# Patient Record
Sex: Female | Born: 1985 | Race: Black or African American | Hispanic: No | Marital: Single | Smoking: Current every day smoker
Health system: Southern US, Community
[De-identification: ages and names within clinical notes are randomized; demographics above are authoritative.]

## PROBLEM LIST (undated history)

## (undated) ENCOUNTER — Inpatient Hospital Stay: Discharge: 2008-10-28 | Disposition: A | Discharge status: Home / Self Care | Payer: PRIVATE HEALTH INSURANCE

## (undated) ENCOUNTER — Inpatient Hospital Stay (HOSPITAL_COMMUNITY): Payer: Self-pay

## (undated) HISTORY — PX: OTHER SURGICAL HISTORY: SHX169

---

## 2007-08-10 LAB — CBC WITH AUTOMATED DIFF
ABS. BASOPHILS: 0 10*3/uL (ref 0.0–0.1)
ABS. EOSINOPHILS: 0.1 10*3/uL (ref 0.0–0.4)
ABS. LYMPHOCYTES: 2 10*3/uL (ref 0.8–3.5)
ABS. MONOCYTES: 0.6 10*3/uL (ref 0–1.0)
ABS. NEUTROPHILS: 6.7 10*3/uL (ref 1.8–8.0)
BASOPHILS: 0 % (ref 0–1)
EOSINOPHILS: 1 % (ref 0–7)
HCT: 30.7 % — ABNORMAL LOW (ref 35.0–47.0)
HGB: 10.4 g/dL — ABNORMAL LOW (ref 11.5–16.0)
LYMPHOCYTES: 21 % (ref 12–49)
MCH: 30.9 PG (ref 26.0–34.0)
MCHC: 33.9 g/dL (ref 30.0–35.0)
MCV: 91.1 FL (ref 80.0–99.0)
MONOCYTES: 7 % (ref 5–13)
NEUTROPHILS: 71 % (ref 32–75)
PLATELET: 184 10*3/uL (ref 150–400)
RBC: 3.37 M/uL — ABNORMAL LOW (ref 3.80–5.20)
RDW: 13.5 % (ref 11.5–14.5)
WBC: 9.3 10*3/uL (ref 3.6–11.0)

## 2007-08-11 LAB — CBC WITH AUTOMATED DIFF
ABS. BASOPHILS: 0 10*3/uL (ref 0.0–0.1)
ABS. EOSINOPHILS: 0 10*3/uL (ref 0.0–0.4)
ABS. LYMPHOCYTES: 1.3 10*3/uL (ref 0.8–3.5)
ABS. MONOCYTES: 0.5 10*3/uL (ref 0–1.0)
ABS. NEUTROPHILS: 8.2 10*3/uL — ABNORMAL HIGH (ref 1.8–8.0)
BASOPHILS: 0 % (ref 0–1)
EOSINOPHILS: 0 % (ref 0–7)
HCT: 24.6 % — ABNORMAL LOW (ref 35.0–47.0)
HGB: 8.3 g/dL — ABNORMAL LOW (ref 11.5–16.0)
LYMPHOCYTES: 13 % (ref 12–49)
MCH: 30.7 PG (ref 26.0–34.0)
MCHC: 33.7 g/dL (ref 30.0–35.0)
MCV: 91.1 FL (ref 80.0–99.0)
MONOCYTES: 5 % (ref 5–13)
NEUTROPHILS: 82 % — ABNORMAL HIGH (ref 32–75)
PLATELET: 151 10*3/uL (ref 150–400)
RBC: 2.7 M/uL — ABNORMAL LOW (ref 3.80–5.20)
RDW: 13.6 % (ref 11.5–14.5)
WBC: 10.1 10*3/uL (ref 3.6–11.0)

## 2007-08-11 NOTE — Op Note (Signed)
Name: Whitney Bell, Whitney Bell  MR #: [id number] Surgeon: Enid Baas A. Rosine Door,   M.D.  Account #: [id number] Surgery Date: 08/10/2007  DOB: Dec 15, 1985  Age: 22 Location: [id number]     OPERATIVE REPORT        INDICATIONS: She is a 22 year old, G2, para 1 with a previous cesarean  section, who is at term. She came in at term for a repeat C-section.    PAST MEDICAL SURGICAL HISTORY: Medical: She denies.  Surgical: C-section.    SURGEON: Petra Dumler A. Rosine Door, MD    ASSISTANT: _____    ANESTHESIA: _____    FINDINGS: A female infant in vertex presentation. Normal tubes, ovaries,  and uterus.    COMPLICATIONS: None.    ESTIMATED BLOOD LOSS: 1000 mL.    INTRAVENOUS FLUIDS: Around 1400 mL of lactated Ringer's.    URINE OUTPUT: 180 mL of clear urine.    PATHOLOGY: None.    DESCRIPTION OF PROCEDURE: After assuring informed consent and expressed to  the patient risks, benefits, and alternatives, the patient was taken to the  OR where she signed the consent form. She was taken to the OR. She was  prepped and draped in the usual sterile fashion. A spinal anesthesia was  given. She was placed in dorsal lithotomy position. A Pfannenstiel skin  incision was made and carried to the level of the fascia. The fascia was  nicked up in the middle and extended bilaterally with the Mayo scissors.  The vesicouterine peritoneum was entered sharply with Metzenbaum and  extended bilaterally. An Jon Gills was used. The uterus was entered sharply  with a scalpel and extended bilaterally with the bandage scissors. The  baby's head was delivered atraumatically. Oropharynx was suctioned. There  was a cord around the neck that was reduced, clamped, and cut. The baby  was handed off to the pediatrician. The placenta was manually removed.  The uterus was exteriorized and cleared of all clots and debris. A 0  Vicryl in a running locking fashion was placed in the uterus. Complete  hemostasis was seen. A second 0 Vicryl was used to imbricate the first one   in a running fashion. The gutters were cleaned. The uterus was returned  to the abdomen. The Jon Gills was removed from the abdomen and we put  Intercede in the eschar incision of the uterus. The muscle was  reapproximated with 3-0 Vicryl and the fascia was closed with 1 Vicryl in a  running fashion. The skin was closed with staple. Sponge and instrument  counts were correct x2. A pressure sterile dressing was placed over the  staple. The patient tolerated the procedure well, and she was transferred  to the recovery room in stable condition.            E-Signed By  Alika Saladin A. Rosine Door, M.D. 08/13/2007 11:23  Katonya Blecher A. Rosine Door, M.D.    cc: Tyrica Afzal A. Rosine Door, M.D.        BAE/FI2; D: 08/11/2007 9:49 A; T: 08/11/2007 10:29 A; Doc# [id number]; Job#  [id number]

## 2007-08-11 NOTE — Op Note (Signed)
Op Notes signed by  at 08/13/07 1123                  Author: Darryl Lent, MD  Service: --  Author Type: Physician            Filed:   Date of Service: 08/11/07 1029  Status: Signed          <!--EPICS--> Name:      Whitney Bell, Whitney Bell MR #:      [id number]                    Surgeon:        Enid Baas A. Rosine Door, <BR> M.D.<BR> Account #: [id number]                  Surgery Date:   08/10/2007<BR> DOB:       [birth year]<BR> Age:       22                           Location:       [id number]<BR> <BR>                              OPERATIVE REPORT<BR> <BR> <BR> <BR> INDICATIONS:  She is a 22 year old, G2,  para 1 with a previous cesarean<BR> section, who is at term.  She came in at term for a repeat C-section.<BR> <BR> PAST MEDICAL   SURGICAL HISTORY:  Medical:  She denies.<BR> Surgical:  C-section.<BR> <BR> SURGEON:  Ahkeem Goede A. Rosine Door, MD<BR> <BR> ASSISTANT:   _____<BR> <BR> ANESTHESIA:  _____<BR> <BR> FINDINGS:  A female infant in vertex presentation.  Normal tubes, ovaries,<BR> and uterus.<BR> <BR> COMPLICATIONS:  None.<BR> <BR> ESTIMATED BLOOD LOSS:  1000 mL.<BR> <BR> INTRAVENOUS FLUIDS:  Around 1400 mL  of lactated Ringer's.<BR> <BR> URINE OUTPUT:  180 mL of clear urine.<BR> <BR> PATHOLOGY:  None.<BR> <BR> DESCRIPTION OF PROCEDURE:  After assuring informed consent and expressed to<BR> the patient risks, benefits, and alternatives, the patient was taken  to the<BR> OR where she signed the consent form.  She was taken to the OR.  She was<BR> prepped and draped in the usual sterile fashion.  A spinal anesthesia was<BR> given.  She was placed in dorsal lithotomy position.  A Pfannenstiel skin<BR> incision  was made and carried to the level of the fascia.  The fascia was<BR> nicked up in the middle and extended bilaterally with the Mayo scissors.<BR> The vesicouterine peritoneum was entered sharply with Metzenbaum and<BR> extended bilaterally.  An Jon Gills  was used.  The uterus was entered sharply<BR> with a  scalpel and extended bilaterally with the bandage scissors.  The<BR> baby's head was delivered atraumatically.  Oropharynx was suctioned.  There<BR> was a cord around the neck that was reduced, clamped,  and cut.  The baby<BR> was handed off to the pediatrician.  The placenta was manually removed.<BR> The uterus was exteriorized and cleared of all clots and debris.  A 0<BR> Vicryl in a running locking fashion was placed in the uterus.  Complete<BR> hemostasis  was seen.  A second 0 Vicryl was used to imbricate the first one<BR> in a running fashion.  The gutters were cleaned.  The uterus was returned<BR> to the abdomen.  The Jon Gills was removed from the abdomen and we put<BR> Intercede in the eschar incision  of the uterus.  The muscle was<BR> reapproximated with 3-0 Vicryl and the fascia was closed  with 1 Vicryl in a<BR> running fashion.  The skin was closed with staple.  Sponge and instrument<BR> counts were correct x2.  A pressure sterile dressing was placed  over the<BR> staple.  The patient tolerated the procedure well, and she was transferred<BR> to the recovery room in stable condition.<BR> <BR> <BR> <BR> <BR> <BR> E-Signed By<BR> Olubunmi Rothenberger A. Rosine Door, M.D. 08/13/2007 11:23<BR> Onda Kattner A. Rosine Door, M.D.<BR>  <BR> cc:   Sotiria Keast A. Rosine Door, M.D.<BR> <BR> <BR> <BR> BAE/FI2; D: 08/11/2007  9:49 A; T: 08/11/2007 10:29 A; Doc# [id number]; Job#<BR> [id number]<BJ> <!--EPICE-->

## 2008-02-05 LAB — URINALYSIS W/ REFLEX CULTURE
Bilirubin: NEGATIVE
Blood: NEGATIVE
Epithelial cells: >100 /LPF — ABNORMAL HIGH (ref 0–5)
Glucose: NEGATIVE MG/DL
Ketone: NEGATIVE MG/DL
Leukocyte Esterase: NEGATIVE
Nitrites: NEGATIVE
Protein: NEGATIVE MG/DL
Specific gravity: 1.015 (ref 1.003–1.030)
Urobilinogen: 1 EU/DL (ref 0.2–1.0)
pH (UA): 7.5 (ref 5.0–8.0)

## 2008-02-05 LAB — KOH, OTHER SOURCES: KOH: NONE SEEN

## 2008-02-05 LAB — WET PREP: Wet prep: NONE SEEN

## 2008-02-07 LAB — CULTURE, URINE
Colonies Counted: 50000
Colony Count: 50000

## 2008-02-08 LAB — CHLAMYDIA/GC DNA PROBE
C. trachomatis - DNA probe: NEGATIVE
N. gonorrhoeae - DNA probe: NEGATIVE

## 2008-03-03 LAB — METABOLIC PANEL, COMPREHENSIVE
A-G Ratio: 1 — ABNORMAL LOW (ref 1.1–2.2)
ALT (SGPT): 20 U/L — ABNORMAL LOW (ref 30–65)
AST (SGOT): 15 U/L (ref 15–37)
Albumin: 3.8 g/dL (ref 3.5–5.0)
Alk. phosphatase: 91 U/L (ref 50–136)
Anion gap: 10 mmol/L (ref 5–15)
BUN/Creatinine ratio: 27 — ABNORMAL HIGH (ref 12–20)
BUN: 16 MG/DL (ref 6–20)
Bilirubin, total: 0.4 MG/DL (ref <1.0)
CO2: 25 MMOL/L (ref 21–32)
Calcium: 9.1 MG/DL (ref 8.5–10.1)
Chloride: 102 MMOL/L (ref 97–108)
Creatinine: 0.6 MG/DL (ref 0.6–1.3)
GFR est AA: >60 mL/min/{1.73_m2} (ref >60)
GFR est non-AA: >60 mL/min/{1.73_m2} (ref >60)
Globulin: 4 g/dL (ref 2.0–4.0)
Glucose: 83 MG/DL (ref 65–105)
Potassium: 4.1 MMOL/L (ref 3.5–5.1)
Protein, total: 7.8 g/dL (ref 6.4–8.2)
Sodium: 137 MMOL/L (ref 136–145)

## 2008-03-03 LAB — CBC WITH AUTOMATED DIFF
ABS. BASOPHILS: 0 10*3/uL (ref 0.0–0.1)
ABS. EOSINOPHILS: 0.1 10*3/uL (ref 0.0–0.4)
ABS. LYMPHOCYTES: 0.8 10*3/uL (ref 0.8–3.5)
ABS. MONOCYTES: 0.7 10*3/uL (ref 0.0–1.0)
ABS. NEUTROPHILS: 11.5 10*3/uL — ABNORMAL HIGH (ref 1.8–8.0)
BASOPHILS: 0 % (ref 0–1)
EOSINOPHILS: 1 % (ref 0–7)
HCT: 37.5 % (ref 35.0–47.0)
HGB: 12.7 g/dL (ref 11.5–16.0)
LYMPHOCYTES: 6 % — ABNORMAL LOW (ref 12–49)
MCH: 30.1 PG (ref 26.0–34.0)
MCHC: 33.9 g/dL (ref 30.0–36.5)
MCV: 88.9 FL (ref 80.0–99.0)
MONOCYTES: 5 % (ref 5–13)
NEUTROPHILS: 88 % — ABNORMAL HIGH (ref 32–75)
PLATELET: 229 10*3/uL (ref 150–400)
RBC: 4.22 M/uL (ref 3.80–5.20)
RDW: 13.8 % (ref 11.5–14.5)
WBC: 13.1 10*3/uL — ABNORMAL HIGH (ref 3.6–11.0)

## 2008-03-03 LAB — KOH, OTHER SOURCES: KOH: NONE SEEN

## 2008-03-03 LAB — WET PREP
Clue cells: ABSENT
Wet prep: NONE SEEN

## 2008-03-03 LAB — URINALYSIS W/ REFLEX CULTURE
Bacteria: NEGATIVE /HPF
Bilirubin: NEGATIVE
Blood: NEGATIVE
Glucose: NEGATIVE MG/DL
Leukocyte Esterase: NEGATIVE
Nitrites: NEGATIVE
Protein: NEGATIVE MG/DL
Specific gravity: 1.025 (ref 1.003–1.030)
Urobilinogen: 0.2 EU/DL (ref 0.2–1.0)
pH (UA): 6 (ref 5.0–8.0)

## 2008-03-03 LAB — LIPASE: Lipase: 148 U/L (ref 114–286)

## 2008-03-03 LAB — AMYLASE: Amylase: 79 U/L (ref 25–115)

## 2008-03-04 LAB — CHLAMYDIA/GC DNA PROBE
C. trachomatis - DNA probe: NEGATIVE
N. gonorrhoeae - DNA probe: NEGATIVE

## 2008-08-07 LAB — URINALYSIS W/ REFLEX CULTURE
Bacteria: NEGATIVE /HPF
Bilirubin: NEGATIVE
Blood: NEGATIVE
Glucose: NEGATIVE MG/DL
Ketone: NEGATIVE MG/DL
Leukocyte Esterase: NEGATIVE
Nitrites: NEGATIVE
Protein: NEGATIVE MG/DL
Specific gravity: 1.02 (ref 1.003–1.030)
Urobilinogen: 0.2 EU/DL (ref 0.2–1.0)
pH (UA): 8 (ref 5.0–8.0)

## 2008-08-07 NOTE — ED Provider Notes (Signed)
Patient is a 23 y.o. female presenting with abdominal pain. The history is provided by the patient.   Abdominal Pain   This is a recurrent problem. Episode onset: 2 wks ago. Episode frequency: intermittently. The problem has been gradually worsening. The pain is associated with an unknown factor. The pain is located in the suprapubic region. The quality of the pain is sharp. The pain is at a severity of 7/10. The pain is moderate. Associated symptoms include nausea, vomiting and back pain. Pertinent negatives include no fever, no diarrhea, no dysuria, no frequency and no hematuria. mild vaginal discharge. Nothing worsens the pain. The pain is relieved by nothing. Pt states she has Mirena x 65yr but this is the first time pt states that she has had an irregular period       Past Medical History   Diagnosis Date   ??? Asthma           Past Surgical History   Procedure Date   ??? Delivery c-section            No family history on file.     History   Social History   ??? Marital Status: Single     Spouse Name: N/A     Number of Children: N/A   ??? Years of Education: N/A   Occupational History   ??? Not on file.   Social History Main Topics   ??? Tobacco Use: Yes   ??? Alcohol Use: No   ??? Drug Use: No   ??? Sexually Active: Yes -- Female partner(s)     Birth Control/ Protection: IUD   Other Topics Concern   ??? Not on file   Social History Narrative   ??? No narrative on file           ALLERGIES: Review of patient's allergies indicates no known allergies.      Review of Systems   Constitutional: Negative for fever.   HENT: Negative.    Eyes: Negative.    Respiratory: Negative.    Cardiovascular: Negative.    Gastrointestinal: Positive for nausea, vomiting and abdominal pain. Negative for diarrhea.   Genitourinary: Negative for dysuria, frequency and hematuria.   Musculoskeletal: Positive for back pain.   Neurological: Negative.        Filed Vitals:    08/07/2008  6:50 PM   BP: 120/67   Pulse: 76   Temp: 98.8 ??F (37.1 ??C)   Resp: 18    Height: 5\' 4"  (1.626 m)   Weight: 140 lb (63.504 kg)   SpO2: 98%              Physical Exam   Constitutional: She is oriented to person, place, and time. She appears well-developed and well-nourished. No distress.   HENT:   Head: Normocephalic and atraumatic.   Eyes: Conjunctivae and extraocular motions are normal. Pupils are equal, round, and reactive to light.   Cardiovascular: Normal rate, regular rhythm and normal heart sounds.  Exam reveals no gallop and no friction rub.    No murmur heard.  Pulmonary/Chest: Effort normal and breath sounds normal. No respiratory distress. She has no wheezes. She has no rales.   Abdominal: Soft. Bowel sounds are normal. She exhibits no distension. No tenderness. She has no rebound and no guarding.   Genitourinary: Vagina normal. Cervix exhibits no motion tenderness. Right adnexum displays tenderness. Left adnexum displays tenderness. No discharge found.   Musculoskeletal: Normal range of motion.   Neurological: She is alert and  oriented to person, place, and time.   Skin: Skin is warm and dry.   Psychiatric: She has a normal mood and affect. Her behavior is normal. Judgment and thought content normal.            Coding      Procedures

## 2008-08-07 NOTE — ED Provider Notes (Signed)
I have personally seen and evaluated patient. I find the patient's history and physical exam are consistent with the PA's NP documentation. I agree with the care provided, treatments rendered, disposition and follow up plan.  I have personally seen and evaluated patient. I find the patient's history and physical exam are consistent with the PA's NP documentation. I agree with the care provided, treatments rendered, disposition and follow up plan.

## 2008-08-07 NOTE — ED Notes (Signed)
Discuss with attending will treat pt prophylatically for possible infection secondary to IUD

## 2008-08-08 MED ADMIN — cefTRIAXone (ROCEPHIN) /lidocaine pf IM injection 250mg: INTRAMUSCULAR | @ 01:00:00 | NDC 63323049205

## 2008-08-08 MED ADMIN — azithromycin (ZITHROMAX) tablet 1,000 mg: ORAL | @ 01:00:00 | NDC 68084027811

## 2008-08-08 MED FILL — AZITHROMYCIN 250 MG TAB: 250 mg | ORAL | Qty: 4

## 2008-08-08 MED FILL — XYLOCAINE-MPF 10 MG/ML (1 %) INJECTION SOLUTION: 10 mg/mL (1 %) | INTRAMUSCULAR | Qty: 5

## 2008-10-28 NOTE — ED Provider Notes (Signed)
History provided by: LWBS.        Past Medical History   Diagnosis Date   ??? Asthma           Past Surgical History   Procedure Date   ??? Delivery c-section            No family history on file.     History   Social History   ??? Marital Status: Single     Spouse Name: N/A     Number of Children: N/A   ??? Years of Education: N/A   Occupational History   ??? Not on file.   Social History Main Topics   ??? Tobacco Use: Yes   ??? Alcohol Use: No   ??? Drug Use: No   ??? Sexually Active: Yes -- Female partner(s)     Birth Control/ Protection: IUD   Other Topics Concern   ??? Not on file   Social History Narrative   ??? No narrative on file           ALLERGIES: Review of patient's allergies indicates no known allergies.      Review of Systems    There were no vitals filed for this visit.         Physical Exam   Constitutional:        LWBS        Coding    Procedures

## 2009-04-17 NOTE — ED Notes (Signed)
First call to treatment area, no response.

## 2009-04-17 NOTE — ED Notes (Signed)
Second call to the treatment area, no response.

## 2009-04-18 NOTE — ED Notes (Signed)
I was inadvertently assigned to this patient's treatment team.  I did not see this patient nor did I have any contact with this patient.  I had no involvement during the evaluation, treatment or disposition of this patient.  I am signing off this note to indicate only why my name appeared in the record.

## 2009-09-04 LAB — HCG URINE, QL. - POC: Pregnancy test,urine (POC): NEGATIVE

## 2009-09-04 MED ORDER — NITROFURANTOIN (25% MACROCRYSTAL FORM) 100 MG CAP
100 mg | ORAL_CAPSULE | Freq: Two times a day (BID) | ORAL | Status: AC
Start: 2009-09-04 — End: 2009-09-09

## 2009-09-04 NOTE — ED Notes (Signed)
Pt c/o constant LLQ abd pain that radiates to lower back and yellowish vaginal discharge.

## 2009-09-04 NOTE — ED Notes (Signed)
Pelvic exam tray set up in room 8

## 2009-09-04 NOTE — ED Notes (Addendum)
Pt states, "I can't stay. I have to get my kids."; pt refusing pelvic exam,, Physician at bedside speaking with pt

## 2009-09-04 NOTE — ED Notes (Signed)
I agree with the nurse's assessment

## 2009-09-04 NOTE — ED Notes (Signed)
Pt discharged by MD. Pt verbalizes understanding.

## 2009-09-04 NOTE — ED Provider Notes (Addendum)
HPI Comments: 24 y/o sexually active bf presensts to ED with complaint of left lower abdominal pain, vaginal discharge and concern about her IUD not being in the right place. Pt has had IUD for over 2 years.    Patient is a 24 y.o. female presenting with abdominal pain. The history is provided by the patient. No language interpreter was used.   Abdominal Pain   This is a recurrent problem. The current episode started 6 to 12 hours ago. Episode frequency: intermittent. The problem has been gradually worsening. The pain is located in the LLQ. The quality of the pain is sharp. The pain is at a severity of 8/10. Nothing worsens the pain. Relieved by: rest and hot showers. iud 1-2 yrs       Past Medical History   Diagnosis Date   ??? Asthma           Past Surgical History   Procedure Date   ??? Delivery c-section            No family history on file.     History   Social History   ??? Marital Status: Single     Spouse Name: N/A     Number of Children: N/A   ??? Years of Education: N/A   Occupational History   ??? Not on file.   Social History Main Topics   ??? Smoking status: Current Everyday Smoker   ??? Smokeless tobacco: Not on file   ??? Alcohol Use: No   ??? Drug Use: No   ??? Sexually Active: Yes -- Female partner(s)     Birth Control/ Protection: IUD   Other Topics Concern   ??? Not on file   Social History Narrative   ??? No narrative on file                    ALLERGIES: Review of patient's allergies indicates no known allergies.      Review of Systems   Gastrointestinal: Positive for abdominal pain.   Genitourinary: Positive for vaginal discharge.   All other systems reviewed and are negative.        Filed Vitals:    09/04/09 1424   BP: 90/56   Pulse: 84   Temp: 98.7 ??F (37.1 ??C)   Resp: 16   Height: 5\' 3"  (1.6 m)   Weight: 150 lb (68.04 kg)   SpO2: 99%              Physical Exam   Nursing note and vitals reviewed.  Constitutional: She is oriented to person, place, and time. She appears well-developed and well-nourished.   HENT:    Head: Normocephalic and atraumatic.   Right Ear: External ear normal.   Left Ear: External ear normal.   Mouth/Throat: Oropharynx is clear and moist. No oropharyngeal exudate.   Eyes: Conjunctivae and EOM are normal. Pupils are equal, round, and reactive to light. Right eye exhibits no discharge. Left eye exhibits no discharge. No scleral icterus.   Neck: Normal range of motion. No tracheal deviation present. No thyromegaly present.   Cardiovascular: Normal rate, regular rhythm and normal heart sounds.    No murmur heard.  Pulmonary/Chest: Effort normal and breath sounds normal. No respiratory distress. She has no wheezes. She has no rales. She exhibits no tenderness.   Abdominal: Soft. She exhibits no distension. No tenderness. She has no rebound and no guarding.   Genitourinary:        Pelvic exam not completed. Pt  insisted on leaving ED   Musculoskeletal: Normal range of motion. She exhibits no edema and no tenderness.   Lymphadenopathy:     She has no cervical adenopathy.   Neurological: She is alert and oriented to person, place, and time. No cranial nerve deficit. Coordination normal.   Skin: Skin is warm. No erythema.   Psychiatric: She has a normal mood and affect. Her behavior is normal. Judgment and thought content normal.        MDM    Procedures  I have personally seen and evaluated patient. I find the patient's history and physical exam are consistent with the PA's NP documentation. I agree with the care provided, treatments rendered, disposition and follow up plan.

## 2010-03-20 LAB — AMB POC URINALYSIS DIP STICK AUTO W/O MICRO
Bilirubin (UA POC): NEGATIVE
Blood (UA POC): NEGATIVE
Glucose (UA POC): NEGATIVE
Ketones (UA POC): NEGATIVE
Leukocyte esterase (UA POC): NEGATIVE
Nitrites (UA POC): NEGATIVE
Protein (UA POC): NEGATIVE mg/dL
Specific gravity (UA POC): 1.03 (ref 1.001–1.035)
Urobilinogen (UA POC): 0.2
pH (UA POC): 6.5 (ref 4.6–8.0)

## 2010-03-20 MED ORDER — CITALOPRAM 20 MG TAB
20 mg | ORAL_TABLET | Freq: Every day | ORAL | Status: AC
Start: 2010-03-20 — End: 2010-04-19

## 2010-03-20 NOTE — Progress Notes (Signed)
Pt. Given 2nd hep b vaccine 1ml left deltiod, Pt. Was late, first vaccine was in nov 2011,  Per Dr Cliffton Asters needs to return 09/18/10.

## 2010-03-20 NOTE — Progress Notes (Signed)
HISTORY OF PRESENT ILLNESS  Whitney Bell is a 25 y.o. female.  HPI She presents today as a new patient to establish care and with several complaints.   She is due for 2nd Hep B vaccine and had 1st in 11/11 with no problems.  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  She has a history of cold sore and take acyclovir prn outbreak.  She complains of feeling increasingly depressed and sad.  Happiness score 5/10.  Anhedonia, crying episodes and gaining wt.  No sleeping well.  Tylenol pm helps but often feels sleepy the next day.  Denies suicidal ideation.  She is going to school and is a single mother raising 2 young children.  She has frequent bilateral headaches.  She notes frequently feels fatigued.     She has an IUD and 1 x a month notes some sharp abdominal pain lasting 1 min and is concerned could be related to her IUD.  She is followed by gyn and has appt scheduled for evaluation of IUD.  Current outpatient prescriptions   Medication Sig Dispense Refill   ??? acyclovir (ZOVIRAX) 200 mg capsule Take  by mouth as needed.           No Known Allergies  Past Medical History   Diagnosis Date   ??? Asthma        Past Surgical History   Procedure Date   ??? Delivery c-section    ??? Hx gyn 01/04/2005  08-10-2007     2 c-sections       Family History   Problem Relation Age of Onset   ??? Cancer Maternal Aunt    ??? Cancer Maternal Grandmother      breast   ??? Diabetes Maternal Grandmother    ??? Hypertension Maternal Grandmother        History   Substance Use Topics   ??? Smoking status: Current Everyday Smoker     Types: Cigarettes   ??? Smokeless tobacco: Never Used    Comment: 3 cig. per day   ??? Alcohol Use: Yes      occasionally        ROS  as per HPI     Physical Exam  BP 118/84   Pulse 68   Resp 16   Ht 5\' 3"  (1.6 m)   Wt 161 lb 6.4 oz (73.211 kg)   BMI 28.59 kg/m2   LMP 03/16/2010   GENERAL:  Pleasant female in no apparent distress.  HEENT:  Normocephalic, atraumatic.  Sinuses nontender. Posterior Pharynx clear, no erythema.  NECK:  Supple, full range of motion.  No thyromegaly or adenopathy.  No carotid bruits auscultated.  BACK:  Nontender.  RESPIRATORY:  Lungs clear to auscultation bilaterally without wheezes or crackles.  CARDIAC:  Regular rate and rhythm, no murmurs, rubs or gallops.  BREASTS:  No dimples, retraction, color changes, nipple discharge, or masses present.  No supra- or infraclavicular or axillary nodes palpable.  ABDOMEN:  Soft, nontender.  Positive bowel sounds.  No masses. No hepatosplenomegaly.  EXTREMITIES: Peripheral pulses are symmetric and palpable.  No cyanosis, clubbing, or edema.  NEUROLOGIC:  Nonfocal.  ZO:[id number] to gyn.  SKIN: Normal appearance.      ASSESSMENT and PLAN  Encounter Diagnoses   Code Name Primary?   ??? 780.52A Insomnia likely related to current social stressors and depression  Tylenol pm prn Yes   ??? 784.0 Headache   Second to insomnia    ??? 311L Depression  Long discussion with patient regarding importance of treatment and risk verses benefits of medication treatment are discussed including potential side effects of medication.   Start celexa 20 mg qam with food and reviewed side effects    ??? 530.81S GERD (gastroesophageal reflux disease) mild  tums prn    ??? 780.79B Fatigue  Check screening labs    ??? V82.9AE Screening for blood or protein in urine    ??? V05.3AF Need for hepatitis B vaccination        Orders Placed This Encounter   ??? Hepatitis B vaccine, adult dosage, IM   ??? TSH, 3RD GENERATION   ??? METABOLIC PANEL, BASIC   ??? CBC WITH AUTOMATED DIFF   ??? AMB POC URINALYSIS DIP STICK AUTO W/O MICRO   ??? acyclovir (ZOVIRAX) 200 mg capsule   ??? levonorgestrel (MIRENA) 20 mcg/24 hr IUD   ??? citalopram (CELEXA) 20 mg tablet       Follow-up Disposition:  Return in about 3 weeks (around 04/10/2010).   lab results and schedule of future lab studies reviewed with patient  reviewed diet, exercise and weight control  very strongly urged to quit smoking to reduce cardiovascular risk  cardiovascular risk and specific lipid/LDL goals reviewed  reviewed medications and side effects in detail

## 2010-03-20 NOTE — Progress Notes (Signed)
Establish care.

## 2010-03-20 NOTE — Patient Instructions (Signed)
Insomnia: After Your Visit  Your Care Instructions  Insomnia is the inability to sleep well. It is a common problem for most people at some time. Insomnia may make it hard for you to get to sleep, stay asleep, or sleep as long as you need to. This can make you tired and grouchy during the day. It can also make you forgetful, less effective at work, and unhappy.  Insomnia can be caused by conditions such as depression or anxiety. Pain can also affect your ability to sleep. When these problems are solved, the insomnia usually clears up. But sometimes bad sleep habits can cause insomnia.  If insomnia is affecting your work or your enjoyment of life, you can take steps to improve your sleep.  Follow-up care is a key part of your treatment and safety. Be sure to make and go to all appointments, and call your doctor if you are having problems. It???s also a good idea to know your test results and keep a list of the medicines you take.  How can you care for yourself at home?  What to avoid  ?? Do not have drinks with caffeine, such as coffee or black tea, for 8 hours before bed.   ?? Do not smoke or use other types of tobacco near bedtime. Nicotine is a stimulant and can keep you awake.   ?? Avoid drinking alcohol late in the evening, because it can cause you to wake in the middle of the night.   ?? Do not eat a big meal close to bedtime. If you are hungry, eat a light snack.   ?? Do not drink a lot of water close to bedtime, because the need to urinate may wake you up during the night.   ?? Do not read or watch TV in bed. Use the bed only for sleeping and sexual activity.   What to try  ?? Go to bed at the same time every night, and wake up at the same time every morning. Do not take naps during the day.   ?? Keep your bedroom quiet, dark, and cool.   ?? Get regular exercise, but not within 3 to 4 hours of your bedtime.   ?? Sleep on a comfortable pillow and mattress.    ?? If watching the clock makes you anxious, turn it facing away from you so you cannot see the time.   ?? If you worry when you lie down, start a worry book. Well before bedtime, write down your worries, and then set the book and your concerns aside.   ?? Try meditation or other relaxation techniques before you go to bed.   ?? If you cannot fall asleep, get up and go to another room until you feel sleepy. Do something relaxing. Repeat your bedtime routine before you go to bed again.   ?? Make your house quiet and calm about an hour before bedtime. Turn down the lights, turn off the TV, log off the computer, and turn down the volume on music. This can help you relax after a busy day.   When should you call for help?  Watch closely for changes in your health, and be sure to contact your doctor if:  ?? Your efforts to improve your sleep do not work.   ?? Your insomnia gets worse.   ?? You have been feeling down, depressed, or hopeless or have lost interest in things that you usually enjoy.      Where can you  learn more?    Go to [email or web address]  Enter P513 in the search box to learn more about "Insomnia: After Your Visit."    ?? 2006-2011 [employer]. Care instructions adapted under license by [employer] (which disclaims liability or warranty for this information). This care instruction is for use with your licensed healthcare professional. If you have questions about a medical condition or this instruction, always ask your healthcare professional. [employer] disclaims any warranty or liability for your use of this information.  Content Version: 9.1.125182; Last Revised: June 26, 2010Depression Treatment: After Your Visit  Your Care Instructions   Depression is a condition that affects the way you feel, think, and act. It causes symptoms such as low energy, loss of interest in daily activities, and sadness or grouchiness that goes on for a long time. Depression is very common and affects men and women of all ages.  Depression is a medical illness caused by changes in the natural chemicals in your brain. It is not a character flaw, and it does not mean that you are a bad or weak person. It does not mean that you are going crazy.  It is important to know that depression can be treated. Medicines, counseling, and self-care can all help. Many people do not get help because they are embarrassed or think that they will get over the depression on their own. But some people do not get better without treatment.  Follow-up care is a key part of your treatment and safety. Be sure to make and go to all appointments, and call your doctor if you are having problems. It???s also a good idea to know your test results and keep a list of the medicines you take.  How can you care for yourself at home?  Learn about antidepressant medicines  Antidepressant medicines can improve or end the symptoms of depression. You may need to take the medicine for at least 6 months, and often longer. Keep taking your medicine even if you feel better. If you stop taking it too soon, your symptoms may come back or get worse.  You may start to feel better within 1 to 3 weeks of taking antidepressant medicine. But it can take as many as 6 to 8 weeks to see more improvement. Talk to your doctor if you have problems with your medicine or if you do not notice any improvement after 3 weeks.   Antidepressants can make you feel tired, dizzy, or nervous. Some people have dry mouth, constipation, headaches, sexual problems, an upset stomach, or diarrhea. Many of these side effects are mild and go away on their own after you take the medicine for a few weeks. Some may last longer. Talk to your doctor if side effects bother you too much. You might be able to try a different medicine. If you are pregnant or breast-feeding, talk to your doctor about what medicines you can take.  Learn about counseling  In many cases, counseling can work as well as medicines to treat mild to moderate depression. Counseling is done by licensed mental health providers, such as psychologists, social workers, and some types of nurses. It can be done in one-on-one sessions or in a group setting. Many people find group sessions helpful.  Cognitive-behavioral therapy is a type of counseling. In this treatment therapy, you learn how to see and change unhelpful thinking styles that may be adding to your depression. Counseling and medicines often work well when used together.  To manage depression  ??  Be physically active. Getting 30 minutes of exercise each day is good for your body and your mind. Begin slowly if it is hard for you to get started. If you already exercise, keep it up.   ?? Plan something pleasant for yourself every day. Include activities that you have enjoyed in the past.   ?? Get enough sleep. Talk to your doctor if you have problems sleeping.   ?? Eat a balanced diet. If you do not feel hungry, eat small snacks rather than large meals.   ?? Do not drink alcohol, use illegal drugs, or take medicines that your doctor has not prescribed for you. They may interfere with your treatment.   ?? Spend time with family and friends. It may help to speak openly about your depression with people you trust.   ?? Take your medicines exactly as prescribed. Call your doctor if you think you are having a problem with your medicine.    ?? Do not make major life decisions while you are depressed. Depression may change the way you think. You will be able to make better decisions after you feel better.   ?? Think positively. Challenge negative thoughts with statements such as "I am hopeful"; "Things will get better"; and "I can ask for the help I need." Write down these statements and read them often, even if you don't believe them yet.   ?? Be patient with yourself. It took time for your depression to develop, and it will take time for your symptoms to improve. Do not take on too much or be too hard on yourself.   ?? Learn all you can about depression from written and online materials.   ?? Check out behavioral health classes to learn more about dealing with depression.   When should you call for help?  Call 911 anytime you think you may need emergency care. For example, call if:  ?? You feel you cannot stop from hurting yourself or someone else.   Call your doctor now or seek immediate medical care if:  ?? You hear voices.   ?? You feel much more depressed.   Watch closely for changes in your health, and be sure to contact your doctor if:  ?? You are having problems with your depression medicine.   ?? You are not getting better as expected.      Where can you learn more?    Go to [facility]  Enter [phone] in the search box to learn more about "Depression Treatment: After Your Visit."    ?? 2006-2011 [employer]. Care instructions adapted under license by [employer] (which disclaims liability or warranty for this information). This care instruction is for use with your licensed healthcare professional. If you have questions about a medical condition or this instruction, always ask your healthcare professional. [employer] disclaims any warranty or liability for your use of this information.  Content Version: 9.1.125182; Last Revised: May 04, 2009

## 2010-03-20 NOTE — Progress Notes (Signed)
Patient is here today to establish care.  Wants to discuss that is reoccurring in ribs.

## 2010-03-21 LAB — CBC WITH AUTOMATED DIFF
ABS. BASOPHILS: 0 10*3/uL (ref 0.0–0.2)
ABS. EOSINOPHILS: 0.1 10*3/uL (ref 0.0–0.4)
ABS. IMM. GRANS.: 0 10*3/uL (ref 0.0–0.1)
ABS. LYMPHOCYTES: 2.1 10*3/uL (ref 0.7–4.5)
ABS. MONOCYTES: 0.3 10*3/uL (ref 0.1–1.0)
ABS. NEUTROPHILS: 4.1 10*3/uL (ref 1.8–7.8)
BASOPHILS: 0 % (ref 0–3)
EOSINOPHILS: 1 % (ref 0–7)
HCT: 39 % (ref 34.0–44.0)
HGB: 13 g/dL (ref 11.5–15.0)
IMMATURE GRANULOCYTES: 0 % (ref 0–2)
LYMPHOCYTES: 32 % (ref 14–46)
MCH: 31.8 pg (ref 27.0–34.0)
MCHC: 33.3 g/dL (ref 32.0–36.0)
MCV: 95 fL (ref 80–98)
MONOCYTES: 5 % (ref 4–13)
NEUTROPHILS: 62 % (ref 40–74)
PLATELET: 233 10*3/uL (ref 140–415)
RBC: 4.09 x10E6/uL (ref 3.80–5.10)
RDW: 13.7 % (ref 11.7–15.0)
WBC: 6.6 10*3/uL (ref 4.0–10.5)

## 2010-03-21 LAB — TSH 3RD GENERATION: TSH: 1.43 u[IU]/mL (ref 0.450–4.500)

## 2010-03-21 LAB — METABOLIC PANEL, BASIC
BUN/Creatinine ratio: 19 (ref 8–20)
BUN: 13 mg/dL (ref 6–20)
CO2: 22 mmol/L (ref 20–32)
Calcium: 9.4 mg/dL (ref 8.7–10.2)
Chloride: 103 mmol/L (ref 97–108)
Creatinine: 0.68 mg/dL (ref 0.57–1.00)
GFR est AA: 144 mL/min/{1.73_m2} (ref >59)
GFR est non-AA: 125 mL/min/{1.73_m2} (ref >59)
Glucose: 83 mg/dL (ref 65–99)
Potassium: 3.9 mmol/L (ref 3.5–5.2)
Sodium: 138 mmol/L (ref 134–144)

## 2010-04-14 MED ORDER — HYDROCODONE-ACETAMINOPHEN 5 MG-500 MG TAB
5-500 mg | ORAL_TABLET | ORAL | Status: AC | PRN
Start: 2010-04-14 — End: 2010-04-21

## 2010-04-14 MED ORDER — IBUPROFEN 600 MG TAB
600 mg | ORAL_TABLET | Freq: Four times a day (QID) | ORAL | Status: AC | PRN
Start: 2010-04-14 — End: 2010-05-14

## 2010-04-14 NOTE — ED Notes (Signed)
Patient (s)  given copy of dc instructions and 2 script(s).  Patient (s)  verbalized understanding of instructions and script (s).  Patient given a current medication reconciliation form and verbalized understanding of their medications.   Patient (s) verbalized understanding of the importance of discussing medications with  his or her physician or clinic they will be following up with.  Patient alert and oriented and in no acute distress.  Patient discharged home ambulatory with self.

## 2010-04-14 NOTE — ED Provider Notes (Signed)
Patient is a 25 y.o. female presenting with finger pain. The history is provided by the patient.   Finger Pain   This is a new problem. The current episode started 12 to 24 hours ago. The problem occurs constantly. The problem has not changed since onset.The pain is present in the right fingers (right thumb). The quality of the pain is described as aching. The pain is at a severity of 5/10. The pain is mild. Associated symptoms include limited range of motion and stiffness. Pertinent negatives include no numbness. The symptoms are aggravated by activity. She has tried nothing for the symptoms. There has been a history of trauma (fell going downstairs last night and jammed right thumb, pt is right handed).        Past Medical History   Diagnosis Date   ??? Asthma    ??? Recurrent genital HSV (herpes simplex virus) infection         Past Surgical History   Procedure Date   ??? Delivery c-section    ??? Hx gyn 01/04/2005  08-10-2007     2 c-sections         Family History   Problem Relation Age of Onset   ??? Cancer Maternal Aunt    ??? Cancer Maternal Grandmother      breast   ??? Diabetes Maternal Grandmother    ??? Hypertension Maternal Grandmother         History     Social History   ??? Marital Status: Single     Spouse Name: N/A     Number of Children: N/A   ??? Years of Education: N/A     Occupational History   ??? Not on file.     Social History Main Topics   ??? Smoking status: Current Everyday Smoker     Types: Cigarettes   ??? Smokeless tobacco: Never Used    Comment: 3 cig. per day   ??? Alcohol Use: Yes      occasionally   ??? Drug Use: No   ??? Sexually Active: Yes -- Female partner(s)     Birth Control/ Protection: IUD     Other Topics Concern   ??? 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      Review of Systems   Musculoskeletal: Positive for stiffness.   Neurological: Negative for numbness.    [all other systems reviewed and are negative        Filed Vitals:    04/14/10 1236   BP: 137/81   Pulse: 96   Temp: 98.4 ??F (36.9 ??C)   Resp: 18   Height: 5\' 2"  (1.575 m)   Weight: 165 lb (74.844 kg)   SpO2: 100%            Physical Exam   [nursing notereviewed.  Constitutional: She is oriented to person, place, and time. She appears well-developed and well-nourished.   HENT:   Head: Normocephalic and atraumatic.   Right Ear: External ear normal.   Left Ear: External ear normal.   Mouth/Throat: Oropharynx is clear and moist. No oropharyngeal exudate.   Eyes: Conjunctivae and EOM are normal. Pupils are equal, round, and reactive to light. Right eye exhibits no discharge. Left eye exhibits no discharge. No scleral icterus.  Neck: Normal range of motion. No tracheal deviation present. No thyromegaly present.   Cardiovascular: Normal rate, regular rhythm and normal heart sounds.    No murmur heard.  Pulmonary/Chest: Effort normal and breath sounds normal. No respiratory distress. She has no wheezes. She has no rales. She exhibits no tenderness.   Abdominal: Soft. She exhibits no distension. No tenderness. She has no rebound and no guarding.   Musculoskeletal: She exhibits no edema and no tenderness.        Right thumb proximal phalanx is painful to palpation and is hard to flex that joint   Lymphadenopathy:     She has no cervical adenopathy.   Neurological: She is alert and oriented to person, place, and time. No cranial nerve deficit. Coordination normal.   Skin: Skin is warm. No erythema.   Psychiatric: She has a normal mood and affect. Her behavior is normal. Judgment and thought content normal.        MDM    Procedures

## 2010-07-13 MED ORDER — CETIRIZINE 10 MG TAB
10 mg | ORAL_TABLET | Freq: Every day | ORAL | Status: DC
Start: 2010-07-13 — End: 2010-07-19

## 2010-07-13 MED ORDER — PREDNISONE 20 MG TAB
20 mg | ORAL_TABLET | Freq: Every day | ORAL | Status: AC
Start: 2010-07-13 — End: 2010-07-18

## 2010-07-13 MED ORDER — PROMETHAZINE-CODEINE 6.25 MG-10 MG/5 ML SYRUP
Freq: Four times a day (QID) | ORAL | Status: DC | PRN
Start: 2010-07-13 — End: 2010-07-19

## 2010-07-13 MED ORDER — METHOCARBAMOL 500 MG TAB
500 mg | ORAL_TABLET | Freq: Three times a day (TID) | ORAL | Status: DC
Start: 2010-07-13 — End: 2010-07-19

## 2010-07-13 MED ORDER — AZITHROMYCIN 250 MG TAB
250 mg | ORAL_TABLET | ORAL | Status: AC
Start: 2010-07-13 — End: 2010-07-18

## 2010-07-13 MED ORDER — IBUPROFEN 800 MG TAB
800 mg | ORAL_TABLET | Freq: Four times a day (QID) | ORAL | Status: DC | PRN
Start: 2010-07-13 — End: 2010-07-19

## 2010-07-13 NOTE — ED Provider Notes (Addendum)
Patient is a 25 y.o. female presenting with nasal congestion, back pain, and sore throat. The history is provided by the patient.   Nasal Congestion  This is a new problem. The current episode started yesterday. The problem occurs constantly. The problem has been gradually worsening. Associated symptoms include headaches. Pertinent negatives include no chest pain, no abdominal pain and no shortness of breath. The symptoms are aggravated by coughing, sneezing and smoking. The symptoms are relieved by nothing. She has tried nothing for the symptoms.   Back Pain   This is a new problem. The current episode started more than 1 week ago. The problem has not changed since onset.The problem occurs constantly. 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 The pain is present in the lower back. The quality of the pain is described as aching and dull. The pain does not radiate. The pain is at a severity of 6/10. The pain is moderate. The symptoms are aggravated by bending and twisting. Associated symptoms include headaches. Pertinent negatives include no chest pain and no abdominal pain. She has tried nothing for the symptoms.   Sore Throat   This is a new problem. The current episode started yesterday. The problem has been gradually worsening. There has been no fever. Associated symptoms include congestion and headaches. Pertinent negatives include no shortness of breath. She has had no exposure to strep or mono. She has tried nothing for the symptoms.        Past Medical History   Diagnosis Date   ??? Asthma    ??? Recurrent genital HSV (herpes simplex virus) infection         Past Surgical History   Procedure Date   ??? Delivery c-section    ??? Hx gyn 01/04/2005  08-10-2007     2 c-sections         Family History   Problem Relation Age of Onset   ??? Cancer Maternal Aunt    ??? Cancer Maternal Grandmother      breast   ??? Diabetes Maternal Grandmother     ??? Hypertension Maternal Grandmother         History     Social History   ??? Marital Status: Single     Spouse Name: N/A     Number of Children: N/A   ??? Years of Education: N/A     Occupational History   ??? Not on file.     Social History Main Topics   ??? Smoking status: Current Everyday Smoker     Types: Cigarettes   ??? Smokeless tobacco: Never Used    Comment: 3 cig. per day   ??? Alcohol Use: Yes      occasionally   ??? Drug Use: No   ??? Sexually Active: Yes -- Female partner(s)     Birth Control/ Protection: IUD     Other Topics Concern   ??? 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      Review of Systems   HENT: Positive for congestion, sore throat, rhinorrhea, sneezing, postnasal drip and sinus pressure.    Respiratory: Negative for shortness of breath.    Cardiovascular: Negative for chest pain.   Gastrointestinal: Negative for abdominal pain.  Genitourinary: Negative.    Musculoskeletal: Positive for back pain.   Neurological: Positive for headaches.   [all other systems reviewed and are negative        Filed Vitals:    07/13/10 1714   BP: 117/75   Pulse: 98   Temp: 98.9 ??F (37.2 ??C)   Resp: 16   Height: 5\' 2"  (1.575 m)   Weight: 74.844 kg (165 lb)   SpO2: 100%            Physical Exam   [nursing notereviewed.  Constitutional: She is oriented to person, place, and time. She appears well-developed and well-nourished.   HENT:   Head: Normocephalic and atraumatic.   Right Ear: External ear normal.   Left Ear: External ear normal.   Nose: Mucosal edema and rhinorrhea present. Right sinus exhibits frontal sinus tenderness. Left sinus exhibits frontal sinus tenderness.   Mouth/Throat: Uvula is midline, oropharynx is clear and moist and mucous membranes are normal. No oropharyngeal exudate.    Eyes: Conjunctivae and EOM are normal. Pupils are equal, round, and reactive to light. Right eye exhibits no discharge. Left eye exhibits no discharge. No scleral icterus.   Neck: Normal range of motion. No tracheal deviation present. No thyromegaly present.   Cardiovascular: Normal rate, regular rhythm and normal heart sounds.    No murmur heard.  Pulmonary/Chest: Effort normal and breath sounds normal. No respiratory distress. She has no wheezes. She has no rales. She exhibits no tenderness.   Abdominal: Soft. Bowel sounds are normal. She exhibits no distension. There is no tenderness. There is no rebound and no guarding.   Musculoskeletal: Normal range of motion. She exhibits no edema and no tenderness.   Lymphadenopathy:     She has no cervical adenopathy.   Neurological: She is alert and oriented to person, place, and time. No cranial nerve deficit. Coordination normal.   Skin: Skin is warm. No rash noted. No erythema. No pallor.   Psychiatric: She has a normal mood and affect. Her behavior is normal. Judgment and thought content normal.        MDM    Procedures

## 2010-07-13 NOTE — ED Notes (Signed)
Patient (s)  given copy of dc instructions and 5 script(s).  Patient (s)  verbalized understanding of instructions and script (s).  Patient given a current medication reconciliation form and verbalized understanding of their medications.   Patient (s) verbalized understanding of the importance of discussing medications with  his or her physician or clinic they will be following up with.  Patient alert and oriented and in no acute distress.  Patient discharged home ambulatory with friends.

## 2010-07-14 NOTE — ED Provider Notes (Signed)
I have personally seen and evaluated patient. I find the patient's history and physical exam are consistent with the PA's NP documentation. I agree with the care provided, treatments rendered, disposition and follow up plan.

## 2010-07-19 LAB — KOH, OTHER SOURCES: KOH: NONE SEEN

## 2010-07-19 LAB — URINALYSIS W/ REFLEX CULTURE
Bacteria: NEGATIVE /HPF
Bilirubin: NEGATIVE
Blood: NEGATIVE
Glucose: NEGATIVE MG/DL
Leukocyte Esterase: NEGATIVE
Nitrites: NEGATIVE
Specific gravity: 1.025 (ref 1.003–1.030)
Urobilinogen: 0.2 EU/DL (ref 0.2–1.0)
pH (UA): 6.5 (ref 5.0–8.0)

## 2010-07-19 LAB — HCG URINE, QL. - POC: Pregnancy test,urine (POC): NEGATIVE

## 2010-07-19 LAB — WET PREP: Wet prep: NONE SEEN

## 2010-07-19 MED ORDER — AZITHROMYCIN 250 MG TAB
250 mg | ORAL | Status: AC
Start: 2010-07-19 — End: 2010-07-19
  Administered 2010-07-19: 18:00:00 via ORAL

## 2010-07-19 MED ORDER — NAPROXEN 500 MG TAB
500 mg | ORAL_TABLET | Freq: Two times a day (BID) | ORAL | Status: AC
Start: 2010-07-19 — End: 2010-07-29

## 2010-07-19 MED ORDER — LIDOCAINE (PF) 10 MG/ML (1 %) IJ SOLN
10 mg/mL (1 %) | INTRAMUSCULAR | Status: AC
Start: 2010-07-19 — End: 2010-07-19
  Administered 2010-07-19: 18:00:00 via INTRAMUSCULAR

## 2010-07-19 MED ORDER — METRONIDAZOLE 0.75 % VAGINAL GEL
0.75 % (37.5mg/5 gram) | Freq: Two times a day (BID) | VAGINAL | Status: DC
Start: 2010-07-19 — End: 2010-08-31

## 2010-07-19 NOTE — ED Notes (Signed)
Procedure Note - Pelvic Exam:   Performed by Stephania Fragmin, MD.    The external vulva and vagina are normal in appearance, without rash, lesions or discharge. The speculum exam demonstrates normal vaginal mucosa without discharge. The cervix is normal in appearance. The bimanual exam demonstrates a(n) closed cervix with cervical motion tenderness. The uterus is firm, not enlarged, and non-tender, without palpable masses. The adenexa are tender on right, only.     1405:  Pt. States must leave to pick up kids and will call back to get [place] results.  Disc. A/P with pt.

## 2010-07-19 NOTE — ED Notes (Signed)
Patient (s)  given copy of dc instructions and 2 script(s).  Patient (s)  verbalized understanding of instructions and script (s).  Patient given a current medication reconciliation form and verbalized understanding of their medications.   Patient (s) verbalized understanding of the importance of discussing medications with  his or her physician or clinic they will be following up with.  Patient alert and oriented and in no acute distress.  Patient discharged home ambulatory with self.

## 2010-07-19 NOTE — ED Provider Notes (Signed)
Patient is a 25 y.o. female presenting with abdominal pain. The history is provided by the patient. No language interpreter was used.   Abdominal Pain   This is a new problem. The current episode started more than 1 week ago. The problem has not changed since onset.The pain is associated with an unknown factor. The pain is located in the suprapubic region. The quality of the pain is pressure-like. The pain is mild. Pertinent negatives include no fever, no diarrhea, no nausea, no vomiting, no dysuria, no frequency, no hematuria and no back pain. The pain is worsened by nothing. The pain is relieved by nothing. Past workup includes no CT scan and no ultrasound. Her past medical history does not include PUD or UTI.        Past Medical History   Diagnosis Date   ??? Asthma    ??? Recurrent genital HSV (herpes simplex virus) infection    ??? Neurological disorder      migranes        Past Surgical History   Procedure Date   ??? Delivery c-section    ??? Hx gyn 01/04/2005  08-10-2007     2 c-sections         Family History   Problem Relation Age of Onset   ??? Cancer Maternal Aunt    ??? Cancer Maternal Grandmother      breast   ??? Diabetes Maternal Grandmother    ??? Hypertension Maternal Grandmother         History     Social History   ??? Marital Status: Single     Spouse Name: N/A     Number of Children: N/A   ??? Years of Education: N/A     Occupational History   ??? Not on file.     Social History Main Topics   ??? Smoking status: Current Everyday Smoker     Types: Cigarettes   ??? Smokeless tobacco: Never Used    Comment: 3 cig. per day   ??? Alcohol Use: Yes      occasionally   ??? Drug Use: No   ??? Sexually Active: Yes -- Female partner(s)     Birth Control/ Protection: IUD     Other Topics Concern   ??? 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      Review of Systems   Constitutional: Negative for fever.    Gastrointestinal: Positive for abdominal pain. Negative for nausea, vomiting and diarrhea.   Genitourinary: Negative for dysuria, frequency, hematuria, vaginal discharge and vaginal pain.   Musculoskeletal: Negative for back pain.   [all other systems reviewed and are negative        Filed Vitals:    07/19/10 1025   BP: 135/69   Pulse: 90   Temp: 98.9 ??F (37.2 ??C)   Resp: 18   Height: 5\' 2"  (1.575 m)   Weight: 76.204 kg (168 lb)   SpO2: 99%            Physical Exam   [nursing notereviewed.  Constitutional: She is oriented to person, place, and time. She appears well-developed and well-nourished. No distress.   HENT:   Head: Normocephalic and atraumatic.   Eyes: Conjunctivae and EOM are normal.   Neck: Normal range of  motion. Neck supple.   Cardiovascular: Normal rate, regular rhythm and normal heart sounds.  Exam reveals no gallop.    No murmur heard.  Pulmonary/Chest: Effort normal and breath sounds normal.   Abdominal: Soft. Bowel sounds are normal. She exhibits no distension and no mass. There is no hepatosplenomegaly. There is tenderness in the suprapubic area. There is no rigidity, no rebound, no guarding, no CVA tenderness, no tenderness at McBurney's point and negative Murphy's sign. No hernia.   Musculoskeletal: Normal range of motion.   Neurological: She is alert and oriented to person, place, and time.        Grossly intact without focal deficits      Skin: Skin is warm and dry.   Psychiatric: She has a normal mood and affect. Her behavior is normal.        MDM    Procedures

## 2010-07-20 LAB — CHLAMYDIA/GC PCR
Chlamydia amplified: NEGATIVE
N. gonorrhea, amplified: NEGATIVE

## 2010-08-31 NOTE — ED Provider Notes (Signed)
Patient is a 25 y.o. female presenting with back pain, diarrhea, vomiting, and migraine. The history is provided by the patient. No language interpreter was used.   Back Pain   This is a new problem. The current episode started yesterday. The problem has not changed since onset.The problem occurs daily. Patient reports work related injury.The pain is associated with lifting. The pain is present in the lumbar spine. The quality of the pain is described as stabbing. The pain does not radiate. The pain is at a severity of 8/10. The symptoms are aggravated by bending and certain positions. The pain is the same all the time. Associated symptoms include headaches. Pertinent negatives include no chest pain, no fever, no dysuria, no paresthesias and no paresis. She has tried analgesics for the symptoms. The treatment provided significant relief. Risk factors include obesity. The patient's surgical history non-contributory   Diarrhea   The current episode started yesterday. The problem occurs daily. The problem has been resolved. The pain is associated with an unknown factor. The pain is located in the generalized abdominal region. The quality of the pain is cramping. The pain is mild. Associated symptoms include diarrhea, nausea, vomiting, headaches and back pain. Pertinent negatives include no fever, no dysuria and no chest pain. The pain is worsened by nothing. The pain is relieved by nothing. Her past medical history does not include UTI or DM. The patient's surgical history non-contributory.  Vomiting   This is a new problem. The current episode started yesterday. The problem occurs 2 to 4 times per day. The problem has been resolved. The emesis has an appearance of stomach contents. There has been no fever. Associated symptoms include chills, diarrhea, headaches and headaches. Pertinent negatives include no fever. The patient is not pregnant.Risk factors include ill contacts. Her pertinent negatives include no DM.    Migraine   This is a new problem. The current episode started yesterday. The problem occurs every few hours. The problem has not changed since onset.The headache is aggravated by an unknown factor. The pain is located in the generalized region. The quality of the pain is described as dull. The pain is mild. Associated symptoms include nausea and vomiting. Pertinent negatives include no fever. She has tried acetaminophen for the symptoms. The treatment provided significant relief.        Past Medical History   Diagnosis Date   ??? Asthma    ??? Recurrent genital HSV (herpes simplex virus) infection    ??? Neurological disorder      migranes        Past Surgical History   Procedure Date   ??? Delivery c-section    ??? Hx gyn 01/04/2005  08-10-2007     2 c-sections         Family History   Problem Relation Age of Onset   ??? Cancer Maternal Aunt    ??? Cancer Maternal Grandmother      breast   ??? Diabetes Maternal Grandmother    ??? Hypertension Maternal Grandmother         History     Social History   ??? Marital Status: Single     Spouse Name: N/A     Number of Children: N/A   ??? Years of Education: N/A     Occupational History   ??? Not on file.     Social History Main Topics   ??? Smoking status: Current Everyday Smoker     Types: Cigarettes   ??? Smokeless tobacco: Never  Used    Comment: 3 cig. per day   ??? Alcohol Use: Yes      occasionally   ??? Drug Use: No   ??? Sexually Active: Yes -- Female partner(s)     Birth Control/ Protection: IUD     Other Topics Concern   ??? 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      Review of Systems   Constitutional: Positive for chills. Negative for fever.   Cardiovascular: Negative for chest pain.   Gastrointestinal: Positive for nausea, vomiting and diarrhea.   Genitourinary: Negative for dysuria.   Musculoskeletal: Positive for back pain.    Neurological: Positive for headaches. Negative for paresthesias.   All other systems reviewed and are negative.        Filed Vitals:    08/31/10 1959   BP: 121/76   Pulse: 85   Temp: 98.6 ??F (37 ??C)   Resp: 19   Height: 5\' 3"  (1.6 m)   Weight: 72.576 kg (160 lb)   SpO2: 99%            Physical Exam   Nursing note and vitals reviewed.  Constitutional: She is oriented to person, place, and time. She appears well-developed and well-nourished. No distress.   HENT:   Head: Normocephalic and atraumatic.   Right Ear: External ear normal.   Left Ear: External ear normal.   Nose: Nose normal.   Mouth/Throat: Oropharynx is clear and moist. No oropharyngeal exudate.   Eyes: Conjunctivae and EOM are normal. Pupils are equal, round, and reactive to light. Right eye exhibits no discharge. Left eye exhibits no discharge. No scleral icterus.   Neck: Normal range of motion. Neck supple. No JVD present. No tracheal deviation present. No thyromegaly present.   Cardiovascular: Normal rate, regular rhythm and normal heart sounds.  Exam reveals no gallop and no friction rub.    No murmur heard.  Pulmonary/Chest: Effort normal and breath sounds normal. No stridor. No respiratory distress. She has no wheezes. She has no rales. She exhibits no tenderness.   Abdominal: Soft. Bowel sounds are normal. She exhibits no distension and no mass. There is no tenderness. There is no rebound and no guarding.   Musculoskeletal: Normal range of motion. She exhibits no edema and no tenderness.   Lymphadenopathy:     She has no cervical adenopathy.   Neurological: She is alert and oriented to person, place, and time. No cranial nerve deficit. Coordination normal.   Skin: Skin is warm and dry. No rash noted. She is not diaphoretic. No erythema.   Psychiatric: She has a normal mood and affect. Her behavior is normal. Judgment and thought content normal.        MDM    Procedures

## 2010-08-31 NOTE — ED Notes (Signed)
Patient (s) was given copy of dc instructions and 1 script(s).  Patient (s)  verbalized understanding of instructions and script (s).  Patient given a current medication reconciliation form and verbalized understanding of their medications.   Patient (s) verbalized understanding of the importance of discussing medications with  his or her physician or clinic they will be following up with.  Patient alert and oriented and in no acute distress.  Patient discharged home ambulatory with self.

## 2010-08-31 NOTE — ED Notes (Signed)
Pt complains of "loose stools since yesterday." Also complains of vomiting today x 2 times. Complains of lower back that started yesterday. 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 Denies new injury. Pt also complains of headache that began yesterday. Taken tylenol for her pain. Denies visual disturbances.

## 2010-09-01 LAB — URINALYSIS W/ REFLEX CULTURE
Bacteria: NEGATIVE /HPF
Bilirubin: NEGATIVE
Blood: NEGATIVE
Glucose: NEGATIVE MG/DL
Ketone: NEGATIVE MG/DL
Leukocyte Esterase: NEGATIVE
Nitrites: NEGATIVE
Protein: NEGATIVE MG/DL
Specific gravity: 1.009 (ref 1.003–1.030)
Urobilinogen: 0.2 EU/DL (ref 0.2–1.0)
pH (UA): 6 (ref 5.0–8.0)

## 2010-09-01 LAB — HCG URINE, QL. - POC: Pregnancy test,urine (POC): NEGATIVE

## 2010-09-01 MED ORDER — HYDROXYZINE HCL 50 MG/ML IM SOLN
50 mg/mL | INTRAMUSCULAR | Status: DC
Start: 2010-09-01 — End: 2010-08-31

## 2010-09-01 MED ORDER — PROMETHAZINE 25 MG TAB
25 mg | ORAL_TABLET | Freq: Four times a day (QID) | ORAL | Status: DC | PRN
Start: 2010-09-01 — End: 2010-10-06

## 2010-10-06 MED ORDER — TRAMADOL 50 MG TAB
50 mg | ORAL_TABLET | Freq: Four times a day (QID) | ORAL | Status: AC | PRN
Start: 2010-10-06 — End: 2010-10-16

## 2010-10-06 MED ORDER — IBUPROFEN 600 MG TAB
600 mg | ORAL_TABLET | Freq: Four times a day (QID) | ORAL | Status: AC | PRN
Start: 2010-10-06 — End: 2010-11-05

## 2010-10-06 NOTE — ED Notes (Signed)
Patient (s)  given copy of dc instructions and 2 script(s).  Patient (s)  verbalized understanding of instructions and script (s).  Patient given a current medication reconciliation form and verbalized understanding of their medications.   Patient (s) verbalized understanding of the importance of discussing medications with  his or her physician or clinic they will be following up with.  Patient alert and oriented and in no acute distress.  Patient discharged home ambulatory with self.

## 2010-10-06 NOTE — ED Provider Notes (Addendum)
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 Pt is right handed    Patient is a 25 y.o. female presenting with wrist pain. The history is provided by the patient.   Wrist Pain   This is a new problem. The current episode started 2 days ago. The problem occurs constantly. The problem has not changed since onset.The pain is present in the right wrist. The quality of the pain is described as aching. The pain is at a severity of 5/10. The pain is moderate. Associated symptoms include limited range of motion and stiffness. The symptoms are aggravated by activity. She has tried nothing for the symptoms. The treatment provided no relief. There has been no history of extremity trauma.        Past Medical History   Diagnosis Date   ??? Asthma    ??? Recurrent genital HSV (herpes simplex virus) infection    ??? Neurological disorder      migranes        Past Surgical History   Procedure Date   ??? Delivery c-section    ??? Hx gyn 01/04/2005  08-10-2007     2 c-sections         Family History   Problem Relation Age of Onset   ??? Cancer Maternal Aunt    ??? Cancer Maternal Grandmother      breast   ??? Diabetes Maternal Grandmother    ??? Hypertension Maternal Grandmother         History     Social History   ??? Marital Status: Single     Spouse Name: N/A     Number of Children: N/A   ??? Years of Education: N/A     Occupational History   ??? Not on file.     Social History Main Topics   ??? Smoking status: Current Everyday Smoker     Types: Cigarettes   ??? Smokeless tobacco: Never Used    Comment: 3 cig. per day   ??? Alcohol Use: Yes      occasionally   ??? Drug Use: No   ??? Sexually Active: Yes -- Female partner(s)     Birth Control/ Protection: IUD     Other Topics Concern   ??? 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      Review of Systems    Musculoskeletal: Positive for stiffness.   All other systems reviewed and are negative.        Filed Vitals:    10/06/10 1013   BP: 140/86   Pulse: 81   Temp: 98.4 ??F (36.9 ??C)   Resp: 18   Height: 5\' 2"  (1.575 m)   Weight: 72.576 kg (160 lb)   SpO2: 99%            Physical Exam   Nursing note and vitals reviewed.  Constitutional: She is oriented to person, place, and time. She appears well-developed and well-nourished.   HENT:   Head: Normocephalic and atraumatic.   Right Ear: External ear normal.   Left Ear: External ear normal.   Mouth/Throat: Oropharynx is clear and moist. No oropharyngeal exudate.   Eyes: Conjunctivae and EOM are normal.  Pupils are equal, round, and reactive to light. Right eye exhibits no discharge. Left eye exhibits no discharge. No scleral icterus.   Neck: Normal range of motion. No tracheal deviation present. No thyromegaly present.   Cardiovascular: Normal rate, regular rhythm and normal heart sounds.    No murmur heard.  Pulmonary/Chest: Effort normal and breath sounds normal. No respiratory distress. She has no wheezes. She has no rales. She exhibits no tenderness.   Abdominal: Soft. She exhibits no distension. There is no tenderness. There is no rebound and no guarding.   Musculoskeletal: She exhibits no edema and no tenderness.        Pain to right wrist and pain upon flexion and extension    Lymphadenopathy:     She has no cervical adenopathy.   Neurological: She is alert and oriented to person, place, and time. No cranial nerve deficit. Coordination normal.   Skin: Skin is warm. No erythema.   Psychiatric: She has a normal mood and affect. Her behavior is normal. Judgment and thought content normal.        MDM    APPLY SPLINT: SPECIFY  Date/Time: 10/06/2010 10:44 AM  Performed by: attendingPre-procedure re-eval: Immediately prior to the procedure, the patient was reevaluated and found suitable for the planned procedure and any planned medications.  Location details: right wrist   Splint type: velcro   Approach: velcro   Supplies used: velcro.  Post-procedure: The splinted body part was neurovascularly unchanged following the procedure.  Patient tolerance: Patient tolerated the procedure well with no immediate complications.  My total time at bedside, performing this procedure was 1-15 minutes.

## 2010-12-11 NOTE — Progress Notes (Signed)
Pt received 3rd Hep B inj 1.38ml administered IM in right deltoid. PPD .1ml placed ID in left forearm. Pt is to RTC 12/13/2010 to have PPD read.

## 2010-12-13 LAB — AMB POC TUBERCULOSIS, INTRADERMAL (SKIN TEST): PPD: NEGATIVE

## 2010-12-13 NOTE — Progress Notes (Signed)
PPD read as negative.

## 2011-05-09 NOTE — ED Notes (Signed)
Patient called to treatment area with no response.

## 2011-05-09 NOTE — ED Notes (Signed)
Pt called to room x2. No response.

## 2011-11-18 LAB — HCG URINE, QL. - POC: Pregnancy test,urine (POC): NEGATIVE

## 2011-11-18 MED ADMIN — ibuprofen (MOTRIN) tablet 600 mg: ORAL | @ 13:00:00 | NDC 00904585461

## 2011-11-18 MED FILL — IBUPROFEN 600 MG TAB: 600 mg | ORAL | Qty: 1

## 2011-11-18 NOTE — ED Notes (Signed)
Patient (s)   given copy of dc instructions and 2 script(s).  Patient (s)   verbalized understanding of instructions and script (s).  Patient given a current medication reconciliation form and verbalized understanding of their medications.   Patient (s)  verbalized understanding of the importance of discussing medications with  his or her physician or clinic they will be following up with.  Patient alert and oriented and in no acute distress.  Patient discharged home ambulatory with  .

## 2011-11-18 NOTE — ED Provider Notes (Signed)
HPI Comments: Whitney Bell 26 y.o. presents ambulatory to ED with CC of acute onset of L 5th toe pain and swelling x 3 days ago when a bouncer at a club [place] don her toe. Pt reports sharp pain that is exacerbated with movement and wearing shoes. Pt additionally notes numbness and tingling in R 1st and 2nd toes. 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   Pt denies leg numbness, leg pain or swelling, F/C, or N/V/D.    PCP: None     PMHx: Significant for asthma, recurrent genital HSV, migraines  PSHx: Significant for c-section x 2  PSx: + tobacco, + EtOH, - drug use    There are no other complaints, changes or physical findings at this time.   Written by Sidney Ace, ED Scribe, as dictated by Shari Heritage*.      The history is provided by the patient.        Past Medical History   Diagnosis Date   ??? Asthma    ??? Recurrent genital HSV (herpes simplex virus) infection    ??? Neurological disorder      migranes        Past Surgical History   Procedure Date   ??? Delivery c-section    ??? Hx gyn 01/04/2005  08-10-2007     2 c-sections         Family History   Problem Relation Age of Onset   ??? Cancer Maternal Aunt    ??? Cancer Maternal Grandmother      breast   ??? Diabetes Maternal Grandmother    ??? Hypertension Maternal Grandmother         History     Social History   ??? Marital Status: SINGLE     Spouse Name: N/A     Number of Children: N/A   ??? Years of Education: N/A     Occupational History   ??? Not on file.     Social History Main Topics   ??? Smoking status: Current Everyday Smoker     Types: Cigarettes   ??? Smokeless tobacco: Never Used    Comment: 3 cig. per day   ??? Alcohol Use: Yes      occasionally   ??? Drug Use: No   ??? Sexually Active: Yes -- Female partner(s)     Birth Control/ Protection: IUD     Other Topics Concern   ??? 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      Review of Systems   Constitutional: Negative.  Negative for fever and chills.   HENT: Negative.    Eyes: Negative.    Respiratory: Negative.    Cardiovascular: Negative.  Negative for leg swelling (or pain).   Gastrointestinal: Negative.  Negative for nausea, vomiting and diarrhea.   Genitourinary: Negative.    Musculoskeletal: Positive for back pain, joint swelling (L 5th toe) and arthralgias (L 5th toe).   Skin: Negative.    Neurological: Positive for numbness (and tingling in R 1st and 2nd toes).        -  numbness in legs   All other systems reviewed and are negative.        Filed Vitals:    11/18/11 0811   BP: 105/64   Pulse: 69   Temp: 98.3 ??F (36.8 ??C)   Resp: 18   Height: 5\' 2"  (1.575 m)   Weight: 71.215 kg (157 lb)   SpO2: 98%            Physical Exam   Nursing note and vitals reviewed.   Physical Examination: General appearance - WDWN, in no apparent distress  Head - NC/AT  Eyes - pupils equal, round  and reactive, extraocular eye movements intact, conj/sclera clear, anicteric  Mouth - mucous membranes moist, pharynx normal without lesions  Nose/Ears - nares clear, Tms & canals clear  Neck - supple, no significant adenopathy, trachea midline, no crepitus, c spine diffusely non-tender, no step offs  Chest - Normal respiratory effort, clear to auscultation bilaterally, no wheezes/rales/rhonchi  Heart - normal rate and regular rhythm, S1 and S2 normal, no murmurs, gallops, or rubs  Abdomen - soft, nontender, nondistended, nabs, no masses, guarding, rebound or rigidity  Neurological - alert, oriented, normal speech, cranial nerves intact, no focal motor findings, motor  diffusely intact, normal gait, decreased sensation to light touch in R great toe  Extremities - peripheral pulses normal, no pedal edema, all joints atraumatic, no gross deformities,  redness and swelling L 5th toe, NL cap refill in all toes, tenderness lower back, NL plantar and flexion great toes, NL strength  Skin - normal coloration and turgor, no rashes, no lesions or lacerations   Written by Sidney Ace, ED Scribe, as dictated by Shari Heritage*.     MDM     Differential Diagnosis; Clinical Impression; Plan:     Fracture, sprain, contusion, DDD, sciatica, neuropathy  Amount and/or Complexity of Data Reviewed:   Clinical lab tests:  Ordered and reviewed  Tests in the radiology section of CPT??:  Ordered and reviewed   Review and summarize past medical records:  Yes      Procedures    10:01 AM  The patient's results have been reviewed with them.  Patient and/or family, verbally conveyed their understanding and agreement of the patient's signs, symptoms, diagnosis, treatment and prognosis and additionally agree to follow up as recommended with Dr. Sallyanne Havers, Dr. Mammie Russian as needed, and Dr. Deliah Goody as needed or return to the Emergency Room should their condition change prior to their follow-up appointment. The patient verbally agrees with the care-plan and verbally conveys that all of their questions have been answered. The patient will be discharged with prescriptions for motrin and flexeril.  Written by Sidney Ace, ED Scribe, as dictated by Shari Heritage*.       LABORATORY TESTS:  Recent Results (from the past 12 hour(s))   HCG URINE, QL. - POC    Collection Time    11/18/11  8:51 AM       Component Value Range    Pregnancy test,urine (POC) NEGATIVE   NEGATIVE       IMAGING RESULTS:  **Final Report**      ICD Codes / Adm.Diagnosis: 160582 724.5 / Toe Pain   Examination: CR TOE 5TH MIN 2 VWS LT - [id number] - Nov 18 2011 9:47AM  Accession No: [id number]  Reason: Trauma      REPORT:  INDICATION: Trauma  Toe Pain.  COMPARISON: None available.  TECHNIQUE:  Three views of the left fifth toe were obtained.  FINDINGS:  There may be mild degenerative changes at the distal  interphalangeal joint.  No radiographic evidence of fracture, dislocation or other acute osseous   abnormality.  No radiopaque foreign bodies.      IMPRESSION: No acute osseous abnormality demonstrated.              Signing/Reading Doctor: Solon Augusta [phone])   Approved: Solon Augusta [phone]) Nov 18 2011 9:57AM       **Final Report**      ICD Codes / Adm.Diagnosis: 284132 724.5 / Toe Pain   Examination: CR L SPINE 2 OR 3 VWS - [id number] - Nov 18 2011 9:47AM  Accession No: [id number]  Reason: Trauma      REPORT:  INDICATION: Trauma  COMPARISON: None available.  TECHNIQUE:  Three views of the lumbar spine were obtained.  FINDINGS:  Normal alignment. 5 lumbar type vertebra. Vertebral body heights and disc   spaces are well-maintained.  No radiographic evidence of fracture, dislocation or other acute osseous   abnormality.  IUD noted in the left mid pelvis. Calcifications are probably phleboliths.      IMPRESSION: No acute osseous abnormality demonstrated.              Signing/Reading Doctor: Solon Augusta [phone])   Approved: Solon Augusta [phone]) Nov 18 2011 9:55AM       MEDICATIONS GIVEN:    Medications   ibuprofen (MOTRIN) tablet 600 mg (600 mg Oral Given 11/18/11 0908)       IMPRESSION:  1. Sprain of toe, fifth, left    2. Numbness and tingling of foot        PLAN:  1. F/U with Dr. Sallyanne Havers, Dr. Mammie Russian as needed, and Dr. Deliah Goody as needed  2. Motrin and flexeril  Return to ED if worse

## 2012-02-02 NOTE — ED Provider Notes (Signed)
I have discussed this patient's care with the midlevel provider and I agree with the care provided and follow up.

## 2012-02-02 NOTE — ED Notes (Signed)
I have reviewed discharge instructions with the patient.  The patient verbalized understanding. Ambulated with ride out of ED In nad

## 2012-02-02 NOTE — ED Provider Notes (Signed)
Patient is a 26 y.o. female presenting with vomiting and vaginal discharge. The history is provided by the patient.   Vomiting   This is a new problem. The current episode started 2 days ago. The problem occurs 2 to 4 times per day. The problem has been gradually improving. The emesis has an appearance of stomach contents. There has been no fever. Associated symptoms include abdominal pain, diarrhea, headaches and headaches. Pertinent negatives include no fever. The patient is not pregnant.  Vaginal Discharge   This is a new problem. The current episode started 2 days ago. The problem occurs constantly. The problem has not changed since onset.The discharge was white. She is not pregnant. She has not missed her period. Associated symptoms include abdominal pain, diarrhea and vomiting. Pertinent negatives include no fever, no dysuria and no frequency. She has tried nothing for the symptoms.        Past Medical History   Diagnosis Date   ??? Asthma    ??? Recurrent genital HSV (herpes simplex virus) infection    ??? Neurological disorder      migranes        Past Surgical History   Procedure Laterality Date   ??? Delivery c-section     ??? Hx gyn  01/04/2005  08-10-2007     2 c-sections         Family History   Problem Relation Age of Onset   ??? Cancer Maternal Aunt    ??? Cancer Maternal Grandmother      breast   ??? Diabetes Maternal Grandmother    ??? Hypertension Maternal Grandmother         History     Social History   ??? Marital Status: SINGLE     Spouse Name: N/A     Number of Children: N/A   ??? Years of Education: N/A     Occupational History   ??? Not on file.     Social History Main Topics   ??? Smoking status: Current Every Day Smoker -- 0.50 packs/day for 0 years     Types: Cigarettes   ??? Smokeless tobacco: Never Used      Comment: 3 cig. per day   ??? Alcohol Use: Yes      Comment: occasionally   ??? Drug Use: No   ??? Sexually Active: Yes -- Female partner(s)     Birth Control/ Protection: IUD     Other Topics Concern   ??? 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      Review of Systems   Constitutional: Negative for fever.   Gastrointestinal: Positive for vomiting, abdominal pain and diarrhea.   Genitourinary: Positive for vaginal discharge. Negative for dysuria and frequency.   Neurological: Positive for headaches.   All other systems reviewed and are negative.        Filed Vitals:    02/02/12 2016   BP: 119/68   Pulse: 88   Temp: 98.5 ??F (36.9 ??C)   Resp: 16   Height: 5\' 2"  (1.575 m)   Weight: 71.668 kg (158 lb)   SpO2: 100%            Physical Exam   Vitals reviewed.  Constitutional: She is oriented to person, place, and time.  She appears well-developed and well-nourished. No distress.   HENT:   Head: Normocephalic and atraumatic.   Eyes: Conjunctivae are normal.   Cardiovascular: Normal rate, regular rhythm and normal heart sounds.    Pulmonary/Chest: Effort normal and breath sounds normal. No respiratory distress. She has no wheezes. She has no rales.   Abdominal: Soft. Bowel sounds are normal. She exhibits no distension. There is no tenderness. There is no rebound and no guarding.   Genitourinary: Cervix exhibits no motion tenderness (Mirena string visualized) and no discharge. Right adnexum displays tenderness. Left adnexum displays no tenderness. Vaginal discharge (scant amount of whitish discharge present) found.   Musculoskeletal: Normal range of motion.   Neurological: She is alert and oriented to person, place, and time.   Skin: Skin is warm and dry.        MDM     Differential Diagnosis; Clinical Impression; Plan:     DDX: UTI, STI, pregnancy, gastroenteritis  Amount and/or Complexity of Data Reviewed:   Clinical lab tests:  Ordered and reviewed   Discuss the patient with another provider:  Yes      Procedures

## 2012-02-03 LAB — CHLAMYDIA/GC PCR
Chlamydia amplified: NEGATIVE
N. gonorrhea, amplified: NEGATIVE

## 2012-02-03 LAB — URINALYSIS W/ REFLEX CULTURE
Bilirubin: NEGATIVE
Blood: NEGATIVE
Glucose: NEGATIVE MG/DL
Leukocyte Esterase: NEGATIVE
Nitrites: NEGATIVE
Protein: NEGATIVE MG/DL
Specific gravity: 1.029 (ref 1.003–1.030)
Urobilinogen: 1 EU/DL (ref 0.2–1.0)
pH (UA): 6.5 (ref 5.0–8.0)

## 2012-02-03 LAB — KOH, OTHER SOURCES: KOH: NONE SEEN

## 2012-02-03 LAB — HCG URINE, QL. - POC: Pregnancy test,urine (POC): NEGATIVE

## 2012-02-03 LAB — WET PREP: Wet prep: NONE SEEN

## 2012-02-03 MED ADMIN — acetaminophen (TYLENOL) tablet 1,000 mg: ORAL | @ 03:00:00 | NDC 00904198861

## 2012-02-03 MED FILL — ACETAMINOPHEN 500 MG TAB: 500 mg | ORAL | Qty: 2

## 2012-02-04 LAB — CULTURE, URINE
Colonies Counted: >100000
Colony Count: >100000

## 2012-04-27 LAB — URINALYSIS W/ REFLEX CULTURE
Bilirubin: NEGATIVE
Blood: NEGATIVE
Glucose: NEGATIVE mg/dL
Ketone: NEGATIVE mg/dL
Leukocyte Esterase: NEGATIVE
Nitrites: NEGATIVE
Protein: NEGATIVE mg/dL
Specific gravity: 1.022 (ref 1.003–1.030)
Urobilinogen: 1 EU/dL (ref 0.2–1.0)
pH (UA): 7.5 (ref 5.0–8.0)

## 2012-04-27 LAB — HCG URINE, QL. - POC: Pregnancy test,urine (POC): NEGATIVE

## 2012-04-27 NOTE — ED Provider Notes (Signed)
Patient is a 27 y.o. female presenting with abdominal pain.   Abdominal Pain            I was inadvertently assigned to this patient's treatment team.  I did not see this patient nor did I have any contact with this patient.  I had no involvement during the evaluation, treatment or disposition of this patient.  I am signing off this note to indicate only why my name appeared in the record.  Shari Heritage, MD      Past Medical History   Diagnosis Date   ??? Asthma    ??? Recurrent genital HSV (herpes simplex virus) infection    ??? Neurological disorder      migranes        Past Surgical History   Procedure Laterality Date   ??? Delivery c-section     ??? Hx gyn  01/04/2005  08-10-2007     2 c-sections         Family History   Problem Relation Age of Onset   ??? Cancer Maternal Aunt    ??? Cancer Maternal Grandmother      breast   ??? Diabetes Maternal Grandmother    ??? Hypertension Maternal Grandmother         History     Social History   ??? Marital Status: SINGLE     Spouse Name: N/A     Number of Children: N/A   ??? Years of Education: N/A     Occupational History   ??? Not on file.     Social History Main Topics   ??? Smoking status: Current Every Day Smoker -- 0.50 packs/day for 0 years     Types: Cigarettes   ??? Smokeless tobacco: Never Used      Comment: 3 cig. per day   ??? Alcohol Use: Yes      Comment: occasionally   ??? Drug Use: No   ??? Sexually Active: Yes -- Female partner(s)     Birth Control/ Protection: IUD     Other Topics Concern   ??? 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      Review of Systems   Gastrointestinal: Positive for abdominal pain.       Filed Vitals:    04/27/12 1520   BP: 130/77   Pulse: 74   Temp: 98.2 ??F (36.8 ??C)   Resp: 18   Height: 5\' 2"  (1.575 m)   Weight: 70.308 kg (155 lb)   SpO2: 97%            Physical Exam     MDM    Procedures

## 2012-04-27 NOTE — ED Notes (Signed)
Patient called to treatment area with no response.

## 2012-04-27 NOTE — ED Notes (Signed)
2nd and 3rd cal- NA

## 2012-04-27 NOTE — ED Notes (Signed)
Pt called to ER tx room.  No answer.

## 2012-04-29 LAB — CULTURE, URINE
Colonies Counted: >100000
Colony Count: >100000

## 2012-11-11 NOTE — Patient Instructions (Signed)
Stopping Smoking: After Your Visit  Your Care Instructions  Cigarette smokers crave the nicotine in cigarettes. Giving it up is much harder than simply changing a habit. Your body has to stop craving the nicotine. It is hard to quit, but you can do it. There are many tools that people use to quit smoking. You may find that combining tools works best for you.  There are several steps to quitting. First you get ready to quit. Then you get support to help you. After that, you learn new skills and behaviors to become a nonsmoker. For many people, a necessary step is getting and using medicine.  Your doctor will help you set up the plan that best meets your needs. You may want to attend a smoking cessation program to help you quit smoking. When you choose a program, look for one that has proven success. Ask your doctor for ideas. You will greatly increase your chances of success if you take medicine as well as get counseling or join a cessation program.  Some of the changes you feel when you first quit tobacco are uncomfortable. Your body will miss the nicotine at first, and you may feel short-tempered and grumpy. You may have trouble sleeping or concentrating. Medicine can help you deal with these symptoms. You may struggle with changing your smoking habits and rituals. The last step is the tricky one: Be prepared for the smoking urge to continue for a time. This is a lot to deal with, but keep at it. You will feel better.  Follow-up care is a key part of your treatment and safety. Be sure to make and go to all appointments, and call your doctor if you are having problems. It???s also a good idea to know your test results and keep a list of the medicines you take.  How can you care for yourself at home?  ?? Ask your family, friends, and coworkers for support. You have a better chance of quitting if you have help and support.  ?? Join a support group, such as Nicotine Anonymous, for people who are trying to quit smoking.   ?? Consider signing up for a smoking cessation program, such as the American Lung Association's Freedom from Smoking program.  ?? Set a quit date. Pick your date carefully so that it is not right in the middle of a big deadline or stressful time. Once you quit, do not even take a puff. Get rid of all ashtrays and lighters after your last cigarette. Clean your house and your clothes so that they do not smell of smoke.  ?? Learn how to be a nonsmoker. Think about ways you can avoid those things that make you reach for a cigarette.  ?? Avoid situations that put you at greatest risk for smoking. For some people, it is hard to have a drink with friends without smoking. For others, they might skip a coffee break with coworkers who smoke.  ?? Change your daily routine. Take a different route to work or eat a meal in a different place.  ?? Cut down on stress. Calm yourself or release tension by doing an activity you enjoy, such as reading a book, taking a hot bath, or gardening.  ?? Talk to your doctor or pharmacist about nicotine replacement therapy, which replaces the nicotine in your body. You still get nicotine but you do not use tobacco. Nicotine replacement products help you slowly reduce the amount of nicotine you need. These products come in several   forms, many of them available over-the-counter:  ?? Nicotine patches  ?? Nicotine gum and lozenges  ?? Nicotine inhaler  ?? Ask your doctor about bupropion (Wellbutrin) or varenicline (Chantix), which are prescription medicines. They do not contain nicotine. They help you by reducing withdrawal symptoms, such as stress and anxiety.  ?? Some people find hypnosis, acupuncture, and massage helpful for ending the smoking habit.  ?? Eat a healthy diet and get regular exercise. Having healthy habits will help your body move past its craving for nicotine.  ?? Be prepared to keep trying. Most people are not successful the first few times they try to quit. Do not get mad at yourself if you  smoke again. Make a list of things you learned and think about when you want to try again, such as next week, next month, or next year.   Where can you learn more?   Go to http://www.healthwise.net/[facility]  Enter Y522 in the search box to learn more about "Stopping Smoking: After Your Visit."   ?? 2006-2014 Healthwise, Inc[employer]. Care instructions adapted under license by [facility] (which disclaims liability or warranty for this information). This care instruction is for use with your licensed healthcare professional. If you have questions about a medical condition or this instruction, always ask your healthcare professional. Healthwise, Incorporated disclaims any warranty or liability for your use of this information.  Content Version: 10.1.311062; Current as of: September 24, 2011
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
## 2012-11-13 LAB — AMB POC TUBERCULOSIS, INTRADERMAL (SKIN TEST)
PPD: NEGATIVE Negative
mm Induration: 0 mm

## 2012-11-13 NOTE — Addendum Note (Signed)
Addended by: Kathleen Argue on: 11/13/2012 10:07 AM     Modules accepted: Orders

## 2013-12-23 ENCOUNTER — Emergency Department (HOSPITAL_COMMUNITY)
Admission: EM | Admit: 2013-12-23 | Discharge: 2013-12-24 | Disposition: A | Discharge status: Home / Self Care | Payer: Medicaid - Out of State | Attending: Emergency Medicine | Admitting: Emergency Medicine

## 2013-12-23 ENCOUNTER — Encounter (HOSPITAL_COMMUNITY): Payer: Self-pay | Admitting: Emergency Medicine

## 2013-12-23 ENCOUNTER — Emergency Department (HOSPITAL_COMMUNITY): Payer: Medicaid - Out of State

## 2013-12-23 DIAGNOSIS — R0789 Other chest pain: Secondary | ICD-10-CM | POA: Insufficient documentation

## 2013-12-23 DIAGNOSIS — R109 Unspecified abdominal pain: Secondary | ICD-10-CM | POA: Insufficient documentation

## 2013-12-23 DIAGNOSIS — R079 Chest pain, unspecified: Secondary | ICD-10-CM

## 2013-12-23 DIAGNOSIS — M549 Dorsalgia, unspecified: Secondary | ICD-10-CM | POA: Insufficient documentation

## 2013-12-23 DIAGNOSIS — Z3202 Encounter for pregnancy test, result negative: Secondary | ICD-10-CM | POA: Insufficient documentation

## 2013-12-23 DIAGNOSIS — R51 Headache: Secondary | ICD-10-CM | POA: Diagnosis not present

## 2013-12-23 DIAGNOSIS — Z720 Tobacco use: Secondary | ICD-10-CM | POA: Diagnosis not present

## 2013-12-23 LAB — BASIC METABOLIC PANEL
Anion gap: 13 (ref 5–15)
BUN: 13 mg/dL (ref 6–23)
CALCIUM: 9.2 mg/dL (ref 8.4–10.5)
CHLORIDE: 103 meq/L (ref 96–112)
CO2: 24 meq/L (ref 19–32)
Creatinine, Ser: 0.78 mg/dL (ref 0.50–1.10)
GFR calc Af Amer: >90 mL/min (ref >90)
GFR calc non Af Amer: >90 mL/min (ref >90)
Glucose, Bld: 87 mg/dL (ref 70–99)
Potassium: 3.5 mEq/L — ABNORMAL LOW (ref 3.7–5.3)
Sodium: 140 mEq/L (ref 137–147)

## 2013-12-23 LAB — URINALYSIS, ROUTINE W REFLEX MICROSCOPIC
Bilirubin Urine: NEGATIVE
GLUCOSE, UA: NEGATIVE mg/dL
HGB URINE DIPSTICK: NEGATIVE
Ketones, ur: 15 mg/dL — AB
LEUKOCYTES UA: NEGATIVE
Nitrite: NEGATIVE
PH: 6 (ref 5.0–8.0)
Protein, ur: NEGATIVE mg/dL
Specific Gravity, Urine: 1.03 (ref 1.005–1.030)
Urobilinogen, UA: 1 mg/dL (ref 0.0–1.0)

## 2013-12-23 LAB — I-STAT TROPONIN, ED: Troponin i, poc: 0 ng/mL (ref 0.00–0.08)

## 2013-12-23 LAB — CBC
HEMATOCRIT: 37 % (ref 36.0–46.0)
Hemoglobin: 12.6 g/dL (ref 12.0–15.0)
MCH: 31.7 pg (ref 26.0–34.0)
MCHC: 34.1 g/dL (ref 30.0–36.0)
MCV: 93.2 fL (ref 78.0–100.0)
Platelets: 200 10*3/uL (ref 150–400)
RBC: 3.97 MIL/uL (ref 3.87–5.11)
RDW: 13.2 % (ref 11.5–15.5)
WBC: 7 10*3/uL (ref 4.0–10.5)

## 2013-12-23 LAB — PREGNANCY, URINE: PREG TEST UR: NEGATIVE

## 2013-12-23 MED ORDER — ACETAMINOPHEN 500 MG PO TABS
1000.0000 mg | ORAL_TABLET | Freq: Once | ORAL | Status: AC
  Administered 2013-12-23: 1000 mg via ORAL
  Filled 2013-12-23: qty 2

## 2013-12-23 NOTE — ED Notes (Signed)
Pt c/o left sided and central chest pain x 1 week. Reports pain is intermittent and associated with tightness. Pt also c/o of headache.x several days associated with a pressure. Pt ambulatory to restroom with difficulty

## 2013-12-23 NOTE — ED Notes (Signed)
Pt. reports intermittent mid chest pain and left arm numbness onset last week , denies SOB /nausea or diaphoresis , pt. added headache  And low back pain onset 2 weeks ago , no injury or fall .

## 2013-12-23 NOTE — ED Provider Notes (Signed)
CSN: [id number]     Arrival date & time 12/23/13  1910 History   First MD Initiated Contact with Patient 12/23/13 2146     Chief Complaint  Patient presents with  . Chest Pain  . Back Pain  . Headache     (Consider location/radiation/quality/duration/timing/severity/associated sxs/prior Treatment) HPI [name] is a 28 year old female with no past medical history who presents with chest pain and times one week and right flank pain 2 weeks. Patient states her flank pain has been "off and on" for the past 2 weeks, is an aching pain, and has no aggravating or alleviating factors. Patient states she experiences some associated nausea with the pain when it is present, and her nausea subsides when it is gone. Patient states for the past week she has been experiencing intermittent left sided chest pain along with left arm numbness. Patient describes chest pain as sharp, lasting for several seconds at a time, then resolving spontaneously. Patient states she also experiences the arm numbness which has not seemed to be associated with her chest pain, and also occurs randomly. Patient denies any aggravating or alleviating factors of these. Patient denies shortness of breath, diaphoresis, palpitations, lightheadedness, syncope, abdominal pain, vomiting, diarrhea, dysuria, vaginal pain/discharge/bleeding.  History reviewed. No pertinent past medical history. Past Surgical History  Procedure Laterality Date  . Cesarean section     No family history on file. History  Substance Use Topics  . Smoking status: Current Every Day Smoker  . Smokeless tobacco: Not on file  . Alcohol Use: Yes   OB History    No data available     Review of Systems  Constitutional: Negative for fever.  HENT: Negative for trouble swallowing.   Eyes: Negative for visual disturbance.  Respiratory: Negative for shortness of breath.   Cardiovascular: Positive for chest pain.  Gastrointestinal: Negative for nausea, vomiting  and abdominal pain.  Genitourinary: Negative for dysuria.  Musculoskeletal: Negative for neck pain.  Skin: Negative for rash.  Neurological: Positive for numbness. Negative for dizziness and weakness.  Psychiatric/Behavioral: Negative.       Allergies  Review of patient's allergies indicates no known allergies.  Home Medications   Prior to Admission medications   Not on File   BP 114/76 mmHg  Pulse 64  Temp(Src) 98.6 F (37 C) (Oral)  Resp 14  SpO2 99%  LMP 11/23/2013 Physical Exam  Constitutional: She appears well-developed and well-nourished. No distress.  HENT:  Head: Normocephalic and atraumatic.  Mouth/Throat: Oropharynx is clear and moist. No oropharyngeal exudate.  Eyes: Right eye exhibits no discharge. Left eye exhibits no discharge. No scleral icterus.  Neck: Normal range of motion.  Cardiovascular: Normal rate, regular rhythm, S1 normal, S2 normal and normal heart sounds.   No murmur heard. Pulses:      Radial pulses are 2+ on the right side, and 2+ on the left side.  Pulmonary/Chest: Effort normal and breath sounds normal. No accessory muscle usage. No respiratory distress.    Abdominal: Soft. Normal appearance and bowel sounds are normal. There is no tenderness.  Musculoskeletal: Normal range of motion. She exhibits no edema or tenderness.  Neurological: She is alert. No cranial nerve deficit. Coordination normal. GCS eye subscore is 4. GCS verbal subscore is 5. GCS motor subscore is 6.  Patient fully alert answering questions appropriately in full, clear sentences.  Skin: Skin is warm and dry. No rash noted. She is not diaphoretic.  Psychiatric: She has a normal mood and affect.  Nursing  note and vitals reviewed.   ED Course  Procedures (including critical care time) Labs Review Labs Reviewed  BASIC METABOLIC PANEL - Abnormal; Notable for the following:    Potassium 3.5 (*)    All other components within normal limits  URINALYSIS, ROUTINE W REFLEX  MICROSCOPIC - Abnormal; Notable for the following:    APPearance CLOUDY (*)    Ketones, ur 15 (*)    All other components within normal limits  CBC  PREGNANCY, URINE  I-STAT TROPOININ, ED    Imaging Review Dg Chest 2 View  12/23/2013   CLINICAL DATA:  Chest pain for 1 week.  EXAM: CHEST  2 VIEW  COMPARISON:  None.  FINDINGS: The heart size and mediastinal contours are within normal limits. Both lungs are clear. The visualized skeletal structures are unremarkable.  IMPRESSION: No significant abnormality.   Electronically Signed   By: Geanie CooleyJim  Maxwell M.D.   On: 12/23/2013 20:12     EKG Interpretation   Date/Time:  Thursday December 23 2013 19:17:48 EST Ventricular Rate:  77 PR Interval:  162 QRS Duration: 90 QT Interval:  382 QTC Calculation: 432 R Axis:   74 Text Interpretation:  Normal sinus rhythm Normal ECG No old tracing to  compare Confirmed by [facility] HospitalWENTZ  MD, ELLIOTT [phone]([id number]) on 12/23/2013 11:36:28 PM      MDM   Final diagnoses:  Chest discomfort  Right flank pain    2 weeks of intermittent flank pain on the right, with one week of intermittent chest pain. HEART score 1. Wells score 0.0. Patient remaining asymptomatic during her stay in the ER.patient reporting headache at triage, however denied having any headache for me.  Patient is to be discharged with recommendation to follow up with PCP in regards to today's hospital visit. Chest pain is not likely of cardiac or pulmonary etiology d/t presentation, perc negative, VSS, no tracheal deviation, no JVD or new murmur, RRR, breath sounds equal bilaterally, EKG without acute abnormalities, negative troponin, and negative CXR. Pt has been advised  return to the ED is CP becomes exertional, associated with diaphoresis or nausea, radiates to left jaw/arm, worsens or becomes concerning in any way. Pt appears reliable for follow up and is agreeable to discharge.   Patient has not experienced any flank pain while in the ER. With patient's  flank pain persisting intermittently and mild in nature for the past 2 weeks, I am not as concerned about a nephrolithiasis. Patient's renal function is normal, and there is no signs of renal impairment from a stone. Patient's UA unremarkable for any hematuria or pyuria, and I'm unconcerned for a pyelonephritis. Patient is afebrile, nontoxic tachycardic, well-appearing and in no acute distress. We will discharge patient at this time, and have her follow-up with a primary care physician. I provided patient a resource guide to help her find one.I discussed return precautions with patient, and encourage her to call or return to the ER should her symptoms persist, change, worsen or should she have any questions or concerns.  BP 114/76 mmHg  Pulse 64  Temp(Src) 98.6 F (37 C) (Oral)  Resp 14  SpO2 99%  LMP 11/23/2013  Signed,  Ladona MowJoe Karlene Southard, PA-C 1:42 AM  Patient discussed with Dr. Mancel BaleElliott Wentz, M.D.    Monte FantasiaJoseph W Davianna Deutschman, PA-C 12/24/13 0142  Flint MelterElliott L Wentz, MD 12/24/13 [phone]

## 2013-12-24 NOTE — Discharge Instructions (Signed)
Continued to drink plenty of fluids. He may use Tylenol and alternate with ibuprofen every 3-4 hours as needed for pain and discomfort. Follow up with a primary care physician, and refer to resource guide below to help you find one. Return to the ER with severe chest pain, shortness of breath, fever, nausea, vomiting, abdominal pain.  Chest Pain (Nonspecific) It is often hard to give a specific diagnosis for the cause of chest pain. There is always a chance that your pain could be related to something serious, such as a heart attack or a blood clot in the lungs. You need to follow up with your health care provider for further evaluation. CAUSES   Heartburn.  Pneumonia or bronchitis.  Anxiety or stress.  Inflammation around your heart (pericarditis) or lung (pleuritis or pleurisy).  A blood clot in the lung.  A collapsed lung (pneumothorax). It can develop suddenly on its own (spontaneous pneumothorax) or from trauma to the chest.  Shingles infection (herpes zoster virus). The chest wall is composed of bones, muscles, and cartilage. Any of these can be the source of the pain.  The bones can be bruised by injury.  The muscles or cartilage can be strained by coughing or overwork.  The cartilage can be affected by inflammation and become sore (costochondritis). DIAGNOSIS  Lab tests or other studies may be needed to find the cause of your pain. Your health care provider may have you take a test called an ambulatory electrocardiogram (ECG). An ECG records your heartbeat patterns over a 24-hour period. You may also have other tests, such as:  Transthoracic echocardiogram (TTE). During echocardiography, sound waves are used to evaluate how blood flows through your heart.  Transesophageal echocardiogram (TEE).  Cardiac monitoring. This allows your health care provider to monitor your heart rate and rhythm in real time.  Holter monitor. This is a portable device that records your heartbeat  and can help diagnose heart arrhythmias. It allows your health care provider to track your heart activity for several days, if needed.  Stress tests by exercise or by giving medicine that makes the heart beat faster. TREATMENT   Treatment depends on what may be causing your chest pain. Treatment may include:  Acid blockers for heartburn.  Anti-inflammatory medicine.  Pain medicine for inflammatory conditions.  Antibiotics if an infection is present.  You may be advised to change lifestyle habits. This includes stopping smoking and avoiding alcohol, caffeine, and chocolate.  You may be advised to keep your head raised (elevated) when sleeping. This reduces the chance of acid going backward from your stomach into your esophagus. Most of the time, nonspecific chest pain will improve within 2-3 days with rest and mild pain medicine.  HOME CARE INSTRUCTIONS   If antibiotics were prescribed, take them as directed. Finish them even if you start to feel better.  For the next few days, avoid physical activities that bring on chest pain. Continue physical activities as directed.  Do not use any tobacco products, including cigarettes, chewing tobacco, or electronic cigarettes.  Avoid drinking alcohol.  Only take medicine as directed by your health care provider.  Follow your health care provider's suggestions for further testing if your chest pain does not go away.  Keep any follow-up appointments you made. If you do not go to an appointment, you could develop lasting (chronic) problems with pain. If there is any problem keeping an appointment, call to reschedule. SEEK MEDICAL CARE IF:   Your chest pain does not go  away, even after treatment.  You have a rash with blisters on your chest.  You have a fever. SEEK IMMEDIATE MEDICAL CARE IF:   You have increased chest pain or pain that spreads to your arm, neck, jaw, back, or abdomen.  You have shortness of breath.  You have an  increasing cough, or you cough up blood.  You have severe back or abdominal pain.  You feel nauseous or vomit.  You have severe weakness.  You faint.  You have chills. This is an emergency. Do not wait to see if the pain will go away. Get medical help at once. Call your local emergency services (911 in U.[place] Do not drive yourself to the hospital. MAKE SURE YOU:   Understand these instructions.  Will watch your condition.  Will get help right away if you are not doing well or get worse. Document Released: 11/14/2004 Document Revised: 02/09/2013 Document Reviewed: 09/10/2007 [facility] Patient Information 2015 [place], [place]. This information is not intended to replace advice given to you by your health care provider. Make sure you discuss any questions you have with your health care provider.  Flank Pain Flank pain refers to pain that is located on the side of the body between the upper abdomen and the back. The pain may occur over a short period of time (acute) or may be long-term or reoccurring (chronic). It may be mild or severe. Flank pain can be caused by many things. CAUSES  Some of the more common causes of flank pain include:  Muscle strains.   Muscle spasms.   A disease of your spine (vertebral disk disease).   A lung infection (pneumonia).   Fluid around your lungs (pulmonary edema).   A kidney infection.   Kidney stones.   A very painful skin rash caused by the chickenpox virus (shingles).   Gallbladder disease.  HOME CARE INSTRUCTIONS  Home care will depend on the cause of your pain. In general,  Rest as directed by your caregiver.  Drink enough fluids to keep your urine clear or pale yellow.  Only take over-the-counter or prescription medicines as directed by your caregiver. Some medicines may help relieve the pain.  Tell your caregiver about any changes in your pain.  Follow up with your caregiver as directed. SEEK IMMEDIATE MEDICAL CARE  IF:   Your pain is not controlled with medicine.   You have new or worsening symptoms.  Your pain increases.   You have abdominal pain.   You have shortness of breath.   You have persistent nausea or vomiting.   You have swelling in your abdomen.   You feel faint or pass out.   You have blood in your urine.  You have a fever or persistent symptoms for more than 2-3 days.  You have a fever and your symptoms suddenly get worse. MAKE SURE YOU:   Understand these instructions.  Will watch your condition.  Will get help right away if you are not doing well or get worse. Document Released: 03/28/2005 Document Revised: 10/30/2011 Document Reviewed: 09/19/2011 [facility] Patient Information 2015 [place]Care, [place]LL[place]. This information is not intended to replace advice given to you by your health care provider. Make sure you discuss any questions you have with your health care provider.   Emergency Department Resource Guide 1) Find a Doctor and Pay Out of Pocket Although you won't have to find out who is covered by your insurance plan, it is a good idea to ask around and get recommendations.  You will then need to call the office and see if the doctor you have chosen will accept you as a new patient and what types of options they offer for patients who are self-pay. Some doctors offer discounts or will set up payment plans for their patients who do not have insurance, but you will need to ask so you aren't surprised when you get to your appointment.  2) Contact Your Local Health Department Not all health departments have doctors that can see patients for sick visits, but many do, so it is worth a call to see if yours does. If you don't know where your local health department is, you can check in your phone book. The CDC also has a tool to help you locate your state's health department, and many state websites also have listings of all of their local health departments.  3) Find a  Walk-in Clinic If your illness is not likely to be very severe or complicated, you may want to try a walk in clinic. These are popping up all over the country in pharmacies, drugstores, and shopping centers. They're usually staffed by nurse practitioners or physician assistants that have been trained to treat common illnesses and complaints. They're usually fairly quick and inexpensive. However, if you have serious medical issues or chronic medical problems, these are probably not your best option.  No Primary Care Doctor: - Call Health Connect at  [phone] - they can help you locate a primary care doctor that  accepts your insurance, provides certain services, etc. - Physician Referral Service- [phone]  Chronic Pain Problems: Organization         Address  Phone   Notes  [facility]  [phone] Patients need to be referred by their primary care doctor.   Medication Assistance: Organization         Address  Phone   Notes  [facility] Medication [facility] [address] [place] [place], [place] [address] [phone] --Must be a resident of [facility] -- Must have NO insurance coverage whatsoever (no Medicaid/ Medicare, etc.) -- The pt. MUST have a primary care doctor that directs their care regularly and follows them in the community   [facility]  [phone]   [employer]  [phone]    Agencies that provide inexpensive medical care: Organization         Address  Phone   Notes  [facility]  [phone]   [facility]    [phone]   [facility] [address] [place], [place] [address] [phone]   [facility] [address] [place], [place] [phone]   Planned Parenthood    [phone]   [facility]    [phone]   [facility]  [address], [place] Phone:  [phone], Fax:  [phone] Hours of Operation:  9 am - 6 pm, M-F.  Also accepts Medicaid/Medicare and self-pay.  [facility]  [address], [place] Phone: [phone], Fax: [phone]. Hours of Operation:  8:30 am - 5:30 pm, M-F.  Also accepts Medicaid and self-pay.  [facility] [place] [address] [place] Phone: [phone]   [facility] [address] Natasha Bence [place], [place] [phone], Ext. [phone] Mondays & Thursdays: 7-9 AM.  First 15 patients are seen on a first come,  first serve basis.    Medicaid-accepting [facility] Providers:  Organization         Address  Phone   Notes  [facility] [address], [place] [phone] Also accepts self-pay patients.  [facility] [address] Laurell Josephs [place], [place]  [phone]   [facility] [address], [place] [phone]   [facility] [address], [place] [phone]   Renaye Rakers [address], [place]   [phone] Only accepts [place] Access [place] patients after they have their name applied to their card.   Self-Pay (no insurance) in [facility]:  Organization         Address  Phone   Notes  Sickle Cell Patients, [facility] [address] [place], [place] [phone]   [facility] [address] [place], [place] [phone]   [facility] [place]  [address], [place] [phone]   [facility]/Dr. Osei-Bonsu  [address], [place] or [address], [place] [phone] Phone number for both [place] and [place] locations is the same.  Urgent Medical and [facility] [address], [place] [phone]   [facility] [address], [place] or [address] [phone]     [facility] [address], [place] [phone], phone; [phone], fax Sees patients 1st and 3rd Saturday of every month.  Must not qualify for public or private insurance (i.e. Medicaid, Medicare, [place] Health Choice, Veterans' Benefits)  Household income should be no more than 200% of the poverty level The clinic cannot treat you if you are pregnant or think you are pregnant  Sexually transmitted diseases are not treated at the clinic.    Dental Care: Organization         Address  Phone  Notes  [facility] of [facility] [address] [place], [place] [phone] Accepts children up to age 63 who are enrolled in [place] or [place] Health Choice; pregnant women with a Medicaid card; and children who have applied for Medicaid or [place] Health Choice, but were declined, whose parents can pay a reduced fee at time of service.  [facility] of [facility]  [address], [place] [phone] Accepts children up to age 25 who are enrolled in [place] or [place] Health Choice; pregnant women with a Medicaid card; and children who have applied for Medicaid or [place] Health Choice, but were declined, whose parents can pay a reduced fee at time of service.  [facility]  [address] [place], [place] [phone] Patients are seen by appointment only. Walk-ins are not accepted. [facility] will see patients 73 years of age and older. Monday - Tuesday (8am-5pm) Most Wednesdays (8:30-5pm) $30 per visit, cash only  [facility] Adult Dental Access PRO[facility]  [address], [facility] [phone] Patients are seen by appointment only. Walk-ins are not accepted. [facility] will see patients 36 years of age and older. One Wednesday Evening (Monthly: Volunteer Based).  $30 per visit, cash only  [employer]  [phone] for adults; Children under age 3, call  [facility] Pediatric Dent[facility] at [phone]. Children aged 83-14,  please call [phone] to request a pediatric application.  Dental services are provided in all areas of dental care including fillings, crowns and bridges, complete and partial dentures, implants, gum treatment, root canals, and extractions. Preventive care is also provided. Treatment is provided to both adults and children. Patients are selected via a lottery and there is often a waiting list.   [facility] [address] [place]  [phone] [email or web address]   [facility] [address] [place], [place] [phone], Ext. [phone] Second and Fourth Thursday of each month, opens at 6:30 AM; Clinic ends at 9 AM.  Patients are seen on a first-come first-served basis, and a limited number are seen during each clinic.   [facility]  [address] Ether Griffins [place], [place] [phone]   Eligibility Requirements You must have lived in [place], [place], or [place] counties for at least the last three months.   You cannot be eligible for state or federal sponsored [place], including [employer], [place], or [employer].   You generally cannot be eligible for healthcare insurance through your employer.    How to apply: Eligibility screenings are held every Tuesday and Wednesday afternoon from 1:00 pm until 4:00 pm. You do not need an appointment for the interview!  [facility] [address] [place], [place] [phone]   [facility] Health Department  [phone]   [facility] Health Department  [phone]   [facility] Health Department  [phone]    Behavioral Health Resources in the Community: Intensive Outpatient Programs Organization         Address  Phone  Notes  [facility] Services [address] [place], [place] [phone]   [facility] Outpatient [address] [place], [place]  [phone]   [facility] [address], [place], [place]  [phone]   [facility] Mental Health [address]  [place], [place] [phone] or [phone]   Substance Abuse Resources Organization         Address  Phone  Notes  [facility] Drug [facility]  [phone]   [facility]  [phone]   [facility] [place]  [phone]   Floydene Flock  [phone]   Residential & Outpatient Substance Abuse Program  [phone]   Psychological Services Organization         Address  Phone  Notes  [facility]  [phone]   [facility] Services  [phone]   [facility] Mental Health [address], [place] [phone] or [phone]    Mobile Crisis Teams Organization         Address  Phone  Notes  [facility], Mobile Crisis Care Unit  [phone]   [facility]  [address] [place], [place] [phone]   Doristine Locks [address] [place] [phone]    Self-Help/Support Groups Organization         Address  Phone             Notes  [facility] [place] - variety of support groups  336- I7437963 Call for more information  Narcotics Anonymous (NA), [facility] [address], [employer] [place]  2 meetings at this location   Statistician         Address  Phone  Notes  [facility] [address],    [place]  [phone]   [facility]  [address], [place] [address], [place], [place] [phone]   [facility] [address] [place], [place] [phone] Admissions: 8am-3pm M-F  [facility] [address]    [place], [place] [phone]   [facility] [address] [place], [place], [place] [phone]   [facility] [address]  [place], [place] [phone]   [facility] - Intensive Outpatient [address] Laurell Josephs [address], [place], [place] [phone]   [facility] (Addiction Recovery [facility] [address] [place].,  [place], [place] [phone] or [phone]   [facility] ([facility]) [address] [place], [place] [phone] Accepts Medicaid  [facility] [address],  [place] [phone] Substance Abuse/Addiction Treatment   [facility] Organization         Address  Phone  Notes  [facility]  [phone]   Angie Fava, PhD [address] Ervin Knack [place], [place]   [phone] or [phone]   [facility] Behavioral   [address] [place], [place] [phone]   [facility] [address] [place], [place] [phone] Insurance/Medicaid/sponsorship through [facility] and Families [address]                                    [place], [place] [phone] Therapy/tele-psych/case  [facility] [address], [place] [phone]    Dr. Lolly Mustache  [phone]   [facility]  [employer] [facility] Dept. 1) [address], [place] 2) [address] [place] 3)  [address], [place] [phone]  [phone]   [facility] Child Abuse Hotline 973-873-7559 or [phone] (After Hours)

## 2013-12-24 NOTE — ED Notes (Signed)
EDP at bedside  

## 2014-02-24 ENCOUNTER — Encounter (HOSPITAL_COMMUNITY): Payer: Self-pay | Admitting: Emergency Medicine

## 2014-02-24 DIAGNOSIS — O209 Hemorrhage in early pregnancy, unspecified: Secondary | ICD-10-CM | POA: Insufficient documentation

## 2014-02-24 DIAGNOSIS — F17208 Nicotine dependence, unspecified, with other nicotine-induced disorders: Secondary | ICD-10-CM | POA: Insufficient documentation

## 2014-02-24 DIAGNOSIS — O99330 Smoking (tobacco) complicating pregnancy, unspecified trimester: Secondary | ICD-10-CM | POA: Insufficient documentation

## 2014-02-24 DIAGNOSIS — Z3A08 8 weeks gestation of pregnancy: Secondary | ICD-10-CM | POA: Insufficient documentation

## 2014-02-24 LAB — COMPREHENSIVE METABOLIC PANEL
ALBUMIN: 3.9 g/dL (ref 3.5–5.2)
ALT: 6 U/L (ref 0–35)
AST: 14 U/L (ref 0–37)
Alkaline Phosphatase: 65 U/L (ref 39–117)
Anion gap: 9 (ref 5–15)
BUN: 12 mg/dL (ref 6–23)
CALCIUM: 9.4 mg/dL (ref 8.4–10.5)
CO2: 21 mmol/L (ref 19–32)
CREATININE: 0.67 mg/dL (ref 0.50–1.10)
Chloride: 107 mEq/L (ref 96–112)
GFR calc Af Amer: >90 mL/min (ref >90)
GFR calc non Af Amer: >90 mL/min (ref >90)
GLUCOSE: 107 mg/dL — AB (ref 70–99)
Potassium: 3.9 mmol/L (ref 3.5–5.1)
Sodium: 137 mmol/L (ref 135–145)
Total Bilirubin: 0.4 mg/dL (ref 0.3–1.2)
Total Protein: 7 g/dL (ref 6.0–8.3)

## 2014-02-24 LAB — CBC WITH DIFFERENTIAL/PLATELET
BASOS PCT: 0 % (ref 0–1)
Basophils Absolute: 0 10*3/uL (ref 0.0–0.1)
EOS PCT: 2 % (ref 0–5)
Eosinophils Absolute: 0.2 10*3/uL (ref 0.0–0.7)
HCT: 33.7 % — ABNORMAL LOW (ref 36.0–46.0)
Hemoglobin: 11.2 g/dL — ABNORMAL LOW (ref 12.0–15.0)
Lymphocytes Relative: 33 % (ref 12–46)
Lymphs Abs: 2.5 10*3/uL (ref 0.7–4.0)
MCH: 30.7 pg (ref 26.0–34.0)
MCHC: 33.2 g/dL (ref 30.0–36.0)
MCV: 92.3 fL (ref 78.0–100.0)
MONO ABS: 0.5 10*3/uL (ref 0.1–1.0)
Monocytes Relative: 7 % (ref 3–12)
NEUTROS PCT: 58 % (ref 43–77)
Neutro Abs: 4.4 10*3/uL (ref 1.7–7.7)
PLATELETS: 210 10*3/uL (ref 150–400)
RBC: 3.65 MIL/uL — AB (ref 3.87–5.11)
RDW: 12.9 % (ref 11.5–15.5)
WBC: 7.5 10*3/uL (ref 4.0–10.5)

## 2014-02-24 LAB — URINALYSIS, ROUTINE W REFLEX MICROSCOPIC
BILIRUBIN URINE: NEGATIVE
Glucose, UA: NEGATIVE mg/dL
Ketones, ur: NEGATIVE mg/dL
LEUKOCYTES UA: NEGATIVE
NITRITE: NEGATIVE
PH: 6.5 (ref 5.0–8.0)
Protein, ur: NEGATIVE mg/dL
Specific Gravity, Urine: 1.02 (ref 1.005–1.030)
UROBILINOGEN UA: 1 mg/dL (ref 0.0–1.0)

## 2014-02-24 LAB — URINE MICROSCOPIC-ADD ON

## 2014-02-24 LAB — PREGNANCY, URINE: PREG TEST UR: POSITIVE — AB

## 2014-02-24 LAB — HCG, QUANTITATIVE, PREGNANCY: HCG, BETA CHAIN, QUANT, S: 69 m[IU]/mL — AB (ref <5)

## 2014-02-24 NOTE — ED Notes (Signed)
Pt. reports mild low abdominal cramping and vaginal bleeding onset today , pt. states positive home pregnancy week  test this week - unsure of AOG . No prenatal visit.

## 2014-02-25 ENCOUNTER — Emergency Department (HOSPITAL_COMMUNITY)
Admission: EM | Admit: 2014-02-25 | Discharge: 2014-02-25 | Disposition: A | Discharge status: Home / Self Care | Payer: Medicaid - Out of State | Attending: Emergency Medicine | Admitting: Emergency Medicine

## 2014-02-25 ENCOUNTER — Emergency Department (HOSPITAL_COMMUNITY): Payer: Medicaid - Out of State

## 2014-02-25 DIAGNOSIS — N939 Abnormal uterine and vaginal bleeding, unspecified: Secondary | ICD-10-CM

## 2014-02-25 DIAGNOSIS — O26851 Spotting complicating pregnancy, first trimester: Secondary | ICD-10-CM

## 2014-02-25 NOTE — Discharge Instructions (Signed)
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
## 2014-02-26 ENCOUNTER — Encounter (HOSPITAL_COMMUNITY): Payer: Self-pay | Admitting: *Deleted

## 2014-02-26 ENCOUNTER — Inpatient Hospital Stay (HOSPITAL_COMMUNITY)
Admission: AD | Admit: 2014-02-26 | Discharge: 2014-02-27 | Disposition: A | Discharge status: Home / Self Care | Payer: Medicaid - Out of State | Source: Ambulatory Visit | Attending: Family Medicine | Admitting: Family Medicine

## 2014-02-26 DIAGNOSIS — O209 Hemorrhage in early pregnancy, unspecified: Secondary | ICD-10-CM | POA: Diagnosis present

## 2014-02-26 DIAGNOSIS — Z3A01 Less than 8 weeks gestation of pregnancy: Secondary | ICD-10-CM | POA: Diagnosis not present

## 2014-02-26 DIAGNOSIS — O039 Complete or unspecified spontaneous abortion without complication: Secondary | ICD-10-CM

## 2014-02-26 LAB — HCG, QUANTITATIVE, PREGNANCY: HCG, BETA CHAIN, QUANT, S: 9 m[IU]/mL — AB (ref <5)

## 2014-02-26 NOTE — Discharge Instructions (Signed)
Miscarriage A miscarriage is the sudden loss of an unborn baby (fetus) before the 20th week of pregnancy. Most miscarriages happen in the first 3 months of pregnancy. Sometimes, it happens before a woman even knows she is pregnant. A miscarriage is also called a "spontaneous miscarriage" or "early pregnancy loss." Having a miscarriage can be an emotional experience. Talk with your caregiver about any questions you may have about miscarrying, the grieving process, and your future pregnancy plans. CAUSES   Problems with the fetal chromosomes that make it impossible for the baby to develop normally. Problems with the baby's genes or chromosomes are most often the result of errors that occur, by chance, as the embryo divides and grows. The problems are not inherited from the parents.  Infection of the cervix or uterus.   Hormone problems.   Problems with the cervix, such as having an incompetent cervix. This is when the tissue in the cervix is not strong enough to hold the pregnancy.   Problems with the uterus, such as an abnormally shaped uterus, uterine fibroids, or congenital abnormalities.   Certain medical conditions.   Smoking, drinking alcohol, or taking illegal drugs.   Trauma.  Often, the cause of a miscarriage is unknown.  SYMPTOMS   Vaginal bleeding or spotting, with or without cramps or pain.  Pain or cramping in the abdomen or lower back.  Passing fluid, tissue, or blood clots from the vagina. DIAGNOSIS  Your caregiver will perform a physical exam. You may also have an ultrasound to confirm the miscarriage. Blood or urine tests may also be ordered. TREATMENT   Sometimes, treatment is not necessary if you naturally pass all the fetal tissue that was in the uterus. If some of the fetus or placenta remains in the body (incomplete miscarriage), tissue left behind may become infected and must be removed. Usually, a dilation and curettage (D and C) procedure is performed.  During a D and C procedure, the cervix is widened (dilated) and any remaining fetal or placental tissue is gently removed from the uterus.  Antibiotic medicines are prescribed if there is an infection. Other medicines may be given to reduce the size of the uterus (contract) if there is a lot of bleeding.  If you have Rh negative blood and your baby was Rh positive, you will need a Rh immunoglobulin shot. This shot will protect any future baby from having Rh blood problems in future pregnancies. HOME CARE INSTRUCTIONS   Your caregiver may order bed rest or may allow you to continue light activity. Resume activity as directed by your caregiver.  Have someone help with home and family responsibilities during this time.   Keep track of the number of sanitary pads you use each day and how soaked (saturated) they are. Write down this information.   Do not use tampons. Do not douche or have sexual intercourse until approved by your caregiver.   Only take over-the-counter or prescription medicines for pain or discomfort as directed by your caregiver.   Do not take aspirin. Aspirin can cause bleeding.   Keep all follow-up appointments with your caregiver.   If you or your partner have problems with grieving, talk to your caregiver or seek counseling to help cope with the pregnancy loss. Allow enough time to grieve before trying to get pregnant again.  SEEK IMMEDIATE MEDICAL CARE IF:   You have severe cramps or pain in your back or abdomen.  You have a fever.  You pass large blood clots (walnut-sized   or larger) ortissue from your vagina. Save any tissue for your caregiver to inspect.   Your bleeding increases.   You have a thick, bad-smelling vaginal discharge.  You become lightheaded, weak, or you faint.   You have chills.  MAKE SURE YOU:  Understand these instructions.  Will watch your condition.  Will get help right away if you are not doing well or get  worse. Document Released: 07/31/2000 Document Revised: 06/01/2012 Document Reviewed: 03/26/2011 [employer] Patient Information 2015 [employer]. This information is not intended to replace advice given to you by your health care provider. Make sure you discuss any questions you have with your health care provider.  

## 2014-02-26 NOTE — MAU Note (Addendum)
Was seen at [facility] Th[facility] night due to vag bleeding. Cont to have vag bleeding like her period. No pain. Was told to f/u here in 2 days for repeat labs. Had IUD removed in April so has had irreg. periods

## 2014-02-26 NOTE — MAU Provider Note (Signed)
S: 29 y.o. G1P0 @[redacted]w[redacted]d  by LMP presents to MAU for repeat quant hcg. She reports vaginal bleeding like a menstrual period continues today without change.  She denies abdominal pain, h/a, dizziness, or fever/chills.    Pt is unsure of her blood type but denies receiving Rhogam with previous 2 pregnancies.  Quant hcg on 02/25/14: 69  O: BP 136/69 mmHg  Pulse 78  Temp(Src) 99.1 F (37.3 C)  Ht 5\' 4"  (1.626 m)  Wt 75.751 kg (167 lb)  BMI 28.65 kg/m2  LMP 01/18/2014   Results for orders placed or performed during the hospital encounter of 02/26/14 (from the past 48 hour(s))  hCG, quantitative, pregnancy     Status: Abnormal   Collection Time: 02/26/14 10:10 PM  Result Value Ref Range   hCG, Beta Chain, Quant, S 9 (H) <5 mIU/mL    Comment:          GEST. AGE      CONC.  (mIU/mL)   <=1 WEEK        5 - 50     2 WEEKS       50 - 500     3 WEEKS       100 - 10,000     4 WEEKS     1,000 - 30,000     5 WEEKS     3,500 - 115,000   6-8 WEEKS     12,000 - 270,000    12 WEEKS     15,000 - 220,000        FEMALE AND NON-PREGNANT FEMALE:     LESS THAN 5 mIU/mL    A:  1. Complete miscarriage    P: D/C home with bleeding precautions Comfort packet given by RN F/U in clinic in 1 week for quant hcg Return to MAU as needed for emergencies  Sharen CounterLisa Leftwich-Kirby Certified Nurse-Midwife

## 2014-02-26 NOTE — MAU Note (Signed)
Sharen CounterLisa Leftwich-Kirby CNM notified of pt's admission and status. Will do repeat BHCG and pt may wait for results in lobby. Pt aware and agrees

## 2014-02-27 ENCOUNTER — Encounter (HOSPITAL_COMMUNITY): Payer: Self-pay | Admitting: Advanced Practice Midwife

## 2014-02-27 DIAGNOSIS — O039 Complete or unspecified spontaneous abortion without complication: Secondary | ICD-10-CM | POA: Diagnosis not present

## 2014-02-27 NOTE — Progress Notes (Signed)
Sharen CounterLisa Leftwich-Kirby CNM discharged pt in triage room. Here for f/u labs.  Comfort packet given and Women's support resources discussed ie [facility] services and phone number given. Voiced understanding.

## 2014-03-08 ENCOUNTER — Telehealth: Payer: Self-pay | Admitting: *Deleted

## 2014-03-08 ENCOUNTER — Telehealth: Payer: Self-pay | Admitting: Family Medicine

## 2014-03-08 ENCOUNTER — Encounter: Payer: Self-pay | Admitting: Family Medicine

## 2014-03-08 ENCOUNTER — Other Ambulatory Visit: Payer: Medicaid - Out of State

## 2014-03-08 NOTE — Telephone Encounter (Signed)
Called patient no voicemail setup, mailing certified letter

## 2014-03-08 NOTE — Telephone Encounter (Signed)
Contacted patient, patient forgot the appointment. She states she can come on 03/09/14 @ 4:00pm for lab.  Will put on lab schedule.

## 2014-03-09 ENCOUNTER — Other Ambulatory Visit: Payer: Medicaid - Out of State

## 2014-04-19 ENCOUNTER — Encounter: Payer: Self-pay | Admitting: General Practice

## 2015-01-01 ENCOUNTER — Encounter (HOSPITAL_COMMUNITY): Payer: Self-pay | Admitting: *Deleted

## 2015-02-28 DIAGNOSIS — O3481 Maternal care for other abnormalities of pelvic organs, first trimester: Secondary | ICD-10-CM

## 2015-02-28 NOTE — ED Provider Notes (Signed)
HPI Comments: Whitney Bell is a ~2 month gravid by date, 30 y.o. female [phone]) who presents ambulatory to the ED with c/o n/v/d x 1600 this evening. She reports having had 6 episodes of vomiting and numerous episodes of diarrhea since onset of her sxs and has associated LLQ pain and some tingling paresthesias to her BL hands and feet. She also reports having had some dysuria and mild vaginal discharge along with crampy lower back pain for the past week. Pt states the last day of her LMP was 11/15 and she has not yet seen OBGYN or had a [place] for this pregnancy as she does not have insurance. She denies any vaginal bleeding, fever, CP, or SOB.     PCP: Gloriajean Dell, MD  PMHx significant for: asthma, migraines  PSHx significant for: c-section x 2   Social Hx:  smoking (+) .5ppd                     alcohol (+) occasional                      There are no other complaints, changes, or physical findings at this time.  Written by Charlie Pitter, ED scribe, as dictated by Estevan Kersh N O'Bier, MD       The history is provided by the patient.        Past Medical History:   Diagnosis Date   ??? Asthma    ??? Neurological disorder      migranes   ??? Recurrent genital HSV (herpes simplex virus) infection        Past Surgical History:   Procedure Laterality Date   ??? Delivery c-section     ??? Hx gyn  01/04/2005  08-10-2007     2 c-sections   ??? Hx orthopaedic  07/11/12     left femur         Family History:   Problem Relation Age of Onset   ??? Cancer Maternal Aunt    ??? Cancer Maternal Grandmother      breast   ??? Diabetes Maternal Grandmother    ??? Hypertension Maternal Grandmother        Social History     Social History   ??? Marital status: SINGLE     Spouse name: N/A   ??? Number of children: N/A   ??? Years of education: N/A     Occupational History   ??? Not on file.     Social History Main Topics   ??? Smoking status: Current Every Day Smoker     Packs/day: 0.50     Years: 0.00     Types: Cigarettes   ??? Smokeless tobacco: Never Used       Comment: 3 cig. per day   ??? Alcohol use Yes      Comment: occasionally   ??? Drug use: No   ??? Sexual activity: Yes     Partners: Male     Birth control/ protection: IUD     Other Topics Concern   ??? 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    Review of Systems   Constitutional: Negative.  Negative for fever.   HENT: Negative.    Eyes: Negative.    Respiratory: Negative.  Negative for  shortness of breath.    Cardiovascular: Negative.  Negative for chest pain.   Gastrointestinal: Positive for abdominal pain, diarrhea and vomiting.   Genitourinary: Positive for dysuria and vaginal discharge. Negative for vaginal bleeding.   Musculoskeletal: Positive for back pain.   Skin: Negative.    Neurological:        +tingling paresthesias to BL feet and hands       Vitals:    02/28/15 2043 02/28/15 2327 02/28/15 2329 03/01/15 0220   BP: 118/63 110/65  108/58   Pulse: 78      Resp: 18      Temp: 97.9 ??F (36.6 ??C)      SpO2: 99%  99% 100%   Weight: 75.4 kg (166 lb 3.6 oz)      Height: '5\' 4"'  (1.626 m)               Physical Exam   Nursing note and vitals reviewed.  General appearance - well nourished, well appearing, and in no distress  Eyes - pupils equal and reactive, extraocular eye movements intact  ENT - mucous membranes moist, pharynx normal without lesions  Neck - supple, no significant adenopathy; non-tender to palpation  Chest - clear to auscultation, no wheezes, rales or rhonchi; non-tender to palpation  Heart - normal rate and regular rhythm, S1 and S2 normal, no murmurs noted  Abdomen - soft, LLQ and suprapubic tenderness, nondistended, no masses or organomegaly  Musculoskeletal - no joint tenderness, deformity or swelling; normal ROM  Extremities - peripheral pulses normal, no pedal edema  Skin - normal coloration and turgor, no rashes   Neurological - alert, oriented x3, normal speech, no focal findings or movement disorder noted  Written by Charlie Pitter, ED scribe, as dictated by Koralynn Greenspan N O'Bier, MD.      MDM  Number of Diagnoses or Management Options  Diagnosis management comments: DDx: UTI, PID, gastroenteritis, ectopic pregnancy         Amount and/or Complexity of Data Reviewed  Clinical lab tests: reviewed and ordered  Tests in the radiology section of CPT??: ordered and reviewed  Review and summarize past medical records: yes      ED Course       Procedures    Procedure Note - Pelvic Exam:    12:13 AM  Performed by: Trentan Trippe Jonelle Sidle, MD   Chaperoned by: Ander Purpura, RN  Pelvic exam was performed using bimanual and speculum. Uterus is enlarged and tender but otherwise unremarkable exam, no discharge or bleeding.  The procedure took 1-15 minutes, and pt tolerated well.  Written by Dorcas Carrow Fu , ED Scribe, as dictated by Isaish Alemu N O'Bier, MD.    Progress Note:  2:18 AM  Pt has been re-evaluated, noted to be feeling better, tolerating PO popsicle. US shows a live IUP at 7 weeks 6 days. UA with 1+ bacteria, clue cells present on wet prep. Will cover with Keflex and d/c with metrogel and Zofran to f/u with a OBGYN  Written by Dorcas Carrow. Fu, ED scribe, as dictated by Emonii Wienke N O'Bier, MD      LABORATORY TESTS:  Recent Results (from the past 12 hour(s))   CBC WITH AUTOMATED DIFF    Collection Time: 02/28/15  9:19 PM   Result Value Ref Range    WBC 17.9 (H) 3.6 - 11.0 K/uL    RBC 4.14 3.80 - 5.20 M/uL    HGB 13.6 11.5 - 16.0 g/dL    HCT 39.1 35.0 -  47.0 %    MCV 94.4 80.0 - 99.0 FL    MCH 32.9 26.0 - 34.0 PG    MCHC 34.8 30.0 - 36.5 g/dL    RDW 13.3 11.5 - 14.5 %    PLATELET 222 150 - 400 K/uL    NEUTROPHILS 91 (H) 32 - 75 %    LYMPHOCYTES 4 (L) 12 - 49 %    MONOCYTES 5 5 - 13 %    EOSINOPHILS 0 0 - 7 %    BASOPHILS 0 0 - 1 %    ABS. NEUTROPHILS 16.3 (H) 1.8 - 8.0 K/UL    ABS. LYMPHOCYTES 0.7 (L) 0.8 - 3.5 K/UL    ABS. MONOCYTES 0.9 0.0 - 1.0 K/UL     ABS. EOSINOPHILS 0.0 0.0 - 0.4 K/UL    ABS. BASOPHILS 0.0 0.0 - 0.1 K/UL    DF SMEAR SCANNED      RBC COMMENTS NORMOCYTIC, NORMOCHROMIC     METABOLIC PANEL, COMPREHENSIVE    Collection Time: 02/28/15  9:19 PM   Result Value Ref Range    Sodium 135 (L) 136 - 145 mmol/L    Potassium 3.8 3.5 - 5.1 mmol/L    Chloride 104 97 - 108 mmol/L    CO2 22 21 - 32 mmol/L    Anion gap 9 5 - 15 mmol/L    Glucose 91 65 - 100 mg/dL    BUN 11 6 - 20 MG/DL    Creatinine 0.70 0.55 - 1.02 MG/DL    BUN/Creatinine ratio 16 12 - 20      GFR est AA >60 >60 ml/min/1.44m    GFR est non-AA >60 >60 ml/min/1.714m   Calcium 9.3 8.5 - 10.1 MG/DL    Bilirubin, total 0.3 0.2 - 1.0 MG/DL    ALT 11 (L) 12 - 78 U/L    AST 12 (L) 15 - 37 U/L    Alk. phosphatase 72 45 - 117 U/L    Protein, total 9.0 (H) 6.4 - 8.2 g/dL    Albumin 4.2 3.5 - 5.0 g/dL    Globulin 4.8 (H) 2.0 - 4.0 g/dL    A-G Ratio 0.9 (L) 1.1 - 2.2     TOTAL HCG, QT.    Collection Time: 02/28/15  9:19 PM   Result Value Ref Range    HCG, Qt. 12425956H) 0 - 6 MIU/ML   HCG URINE, QL. - POC    Collection Time: 02/28/15 11:34 PM   Result Value Ref Range    Pregnancy test,urine (POC) POSITIVE (A) NEG     URINALYSIS W/ REFLEX CULTURE    Collection Time: 02/28/15 11:48 PM   Result Value Ref Range    Color DARK YELLOW      Appearance CLOUDY (A) CLEAR      Specific gravity 1.028 1.003 - 1.030      pH (UA) 6.0 5.0 - 8.0      Protein 100 (A) NEG mg/dL    Glucose NEGATIVE  NEG mg/dL    Ketone 80 (A) NEG mg/dL    Blood NEGATIVE  NEG      Urobilinogen 1.0 0.2 - 1.0 EU/dL    Nitrites NEGATIVE  NEG      Leukocyte Esterase SMALL (A) NEG      WBC 5-10 0 - 4 /hpf    RBC 0-5 0 - 5 /hpf    Epithelial cells FEW FEW /lpf    Bacteria 1+ (A) NEG /hpf    UA:UC IF  INDICATED URINE CULTURE ORDERED (A) CNI      Mucus 1+ (A) NEG /lpf    Hyaline Cast 2-5 0 - 5 /lpf   KOH, OTHER SOURCES    Collection Time: 02/28/15 11:48 PM   Result Value Ref Range    Special Requests: NO SPECIAL REQUESTS      KOH NO YEAST SEEN      WET PREP    Collection Time: 02/28/15 11:48 PM   Result Value Ref Range    Clue cells CLUE CELLS PRESENT      Wet prep NO TRICHOMONAS SEEN     BILIRUBIN, CONFIRM    Collection Time: 02/28/15 11:48 PM   Result Value Ref Range    Bilirubin UA, confirm NEGATIVE  NEG         IMAGING RESULTS:  Study Result    ?? INDICATION: Pregnant/Pain/ please also attempt to visualize RLQ/appy   ??  EXAM: Transabdominal and transvaginal pelvic ultrasound  ??  FINDINGS: On transabdominal imaging the uterus measures 10 x 7.3 x 7.8 cm. A  gestational sac is identified and measures 1.4 x 2.8 x 3.9 cm. Fetal pole  measures 2.1 cm. Fetal heart rate is 171 bpm. It is too early to evaluate the  placenta. Right ovary is not seen due to bowel gas. Right lower quadrant  demonstrates no free fluid or drainable fluid collection. The appendix is not  seen. Left ovary measures 4.8 x 4.1 x 5.2 cm with normal blood flow. Dominant  follicle/cyst measures 4.2 cm.  ??  On transvaginal imaging intrauterine pregnancy is again demonstrated with fetal  heart rate again seen. No mass lesion. The ovaries are not seen due to bowel  gas.  ??  IMPRESSION  IMPRESSION:  1. Single live intrauterine pregnancy estimated gestational age [redacted] weeks 6 days  2. Left ovary contains a 4.2 cm cyst.  3. Right ovary is not seen due to bowel gas         MEDICATIONS GIVEN:  Medications   sodium chloride (NS) flush 5-10 mL (not administered)   sodium chloride (NS) flush 5-10 mL (not administered)   sodium chloride 0.9 % bolus infusion 1,000 mL (0 mL IntraVENous IV Completed 03/01/15 0038)   ondansetron (ZOFRAN) injection 4 mg (4 mg IntraVENous Given 02/28/15 2339)   famotidine (PF) (PEPCID) injection 20 mg (20 mg IntraVENous Given 02/28/15 2339)       IMPRESSION:  1. Gastroenteritis, acute    2. Vomiting complicating pregnancy    3. Ovarian cyst, left    4. BV (bacterial vaginosis)        PLAN:  1. Discharge home  Current Discharge Medication List      START taking these medications     Details   ondansetron (ZOFRAN ODT) 4 mg disintegrating tablet Take 1 Tab by mouth every eight (8) hours as needed for Nausea.  Qty: 10 Tab, Refills: 0      cephALEXin (KEFLEX) 500 mg capsule Take 1 Cap by mouth four (4) times daily for 7 days.  Qty: 28 Cap, Refills: 0      metroNIDAZOLE (METROGEL VAGINAL) 0.75 % vaginal gel Insert 1 Applicator into vagina two (2) times a day for 5 days.  Qty: 1 Tube, Refills: 0           Follow-up Information     Follow up With Details Comments Contact Info    Lina Sayre, MD In 2 days  [place]  [place] [address]  [phone]      [facility]  If symptoms worsen [place]  [phone]      Return to ED if worse     DISCHARGE NOTE  2:18 AM   Pt has been re-evaluated. She has no new complaints, changes, or physical findings. All diagnostic results have been reviewed and discussed with the pt. Care plan has been outlined and discussed, and she understands all current sx, dx, tx, and rx. All of the pt's questions have been answered and concerns addressed. All medications have been reviewed with the pt. She was instructed to and agrees to follow up with an OBGYN, or to return to the ED should her sxs worsen prior to follow up. ADREENA WILLITS is ready for discharge.    This note is prepared by Charlie Pitter, acting as scribe for Jahmiyah Dullea N O'Bier, MD.    Karlei Waldo Jonelle Sidle, MD: The scribe's documentation has been prepared under my direction and personally reviewed by me in its entirety. I confirm that the note above accurately reflects all work, treatment, procedures, and medical decision making performed by me                                           This note will not be viewable in [place].

## 2015-02-28 NOTE — ED Notes (Signed)
Assumed care of patient from triage. Patient arrives with complaints of nausea/vomiting  Since 1600. Patient reports that she is ~692months pregnant. Patient also reports diarrhea with abdominal cramping. Patient resting comfortably on stretcher, placed on the monitor x 2 with side rails and call bell in reach.

## 2015-03-01 ENCOUNTER — Emergency Department: Admit: 2015-03-01 | Payer: MEDICAID | Primary: Family Medicine

## 2015-03-01 ENCOUNTER — Inpatient Hospital Stay
Admit: 2015-03-01 | Discharge: 2015-03-01 | Disposition: A | Discharge status: Home / Self Care | Payer: MEDICAID | Attending: Emergency Medicine

## 2015-03-01 LAB — BETA HCG, QT
Beta HCG, QT: 121904 m[IU]/mL — ABNORMAL HIGH (ref 0–6)
hCG Quant: 121904 m[IU]/mL — ABNORMAL HIGH (ref 0–6)

## 2015-03-01 LAB — CBC WITH AUTOMATED DIFF
ABS. BASOPHILS: 0 10*3/uL (ref 0.0–0.1)
ABS. EOSINOPHILS: 0 10*3/uL (ref 0.0–0.4)
ABS. LYMPHOCYTES: 0.7 10*3/uL — ABNORMAL LOW (ref 0.8–3.5)
ABS. MONOCYTES: 0.9 10*3/uL (ref 0.0–1.0)
ABS. NEUTROPHILS: 16.3 10*3/uL — ABNORMAL HIGH (ref 1.8–8.0)
BASOPHILS: 0 % (ref 0–1)
EOSINOPHILS: 0 % (ref 0–7)
HCT: 39.1 % (ref 35.0–47.0)
HGB: 13.6 g/dL (ref 11.5–16.0)
LYMPHOCYTES: 4 % — ABNORMAL LOW (ref 12–49)
MCH: 32.9 PG (ref 26.0–34.0)
MCHC: 34.8 g/dL (ref 30.0–36.5)
MCV: 94.4 FL (ref 80.0–99.0)
MONOCYTES: 5 % (ref 5–13)
NEUTROPHILS: 91 % — ABNORMAL HIGH (ref 32–75)
PLATELET: 222 10*3/uL (ref 150–400)
RBC: 4.14 M/uL (ref 3.80–5.20)
RDW: 13.3 % (ref 11.5–14.5)
WBC: 17.9 10*3/uL — ABNORMAL HIGH (ref 3.6–11.0)

## 2015-03-01 LAB — WET PREP: Wet prep: NONE SEEN

## 2015-03-01 LAB — CHLAMYDIA/GC PCR
Chlamydia amplified: NEGATIVE
N. gonorrhea, amplified: NEGATIVE

## 2015-03-01 LAB — URINALYSIS W/ REFLEX CULTURE
Blood: NEGATIVE
Glucose: NEGATIVE mg/dL
Ketone: 80 mg/dL — AB
Nitrites: NEGATIVE
Protein: 100 mg/dL — AB
Specific gravity: 1.028 (ref 1.003–1.030)
Urobilinogen: 1 EU/dL (ref 0.2–1.0)
pH (UA): 6 (ref 5.0–8.0)

## 2015-03-01 LAB — METABOLIC PANEL, COMPREHENSIVE
A-G Ratio: 0.9 — ABNORMAL LOW (ref 1.1–2.2)
ALT (SGPT): 11 U/L — ABNORMAL LOW (ref 12–78)
AST (SGOT): 12 U/L — ABNORMAL LOW (ref 15–37)
Albumin: 4.2 g/dL (ref 3.5–5.0)
Alk. phosphatase: 72 U/L (ref 45–117)
Anion gap: 9 mmol/L (ref 5–15)
BUN/Creatinine ratio: 16 (ref 12–20)
BUN: 11 MG/DL (ref 6–20)
Bilirubin, total: 0.3 MG/DL (ref 0.2–1.0)
CO2: 22 mmol/L (ref 21–32)
Calcium: 9.3 MG/DL (ref 8.5–10.1)
Chloride: 104 mmol/L (ref 97–108)
Creatinine: 0.7 MG/DL (ref 0.55–1.02)
GFR est AA: >60 mL/min/{1.73_m2} (ref >60)
GFR est non-AA: >60 mL/min/{1.73_m2} (ref >60)
Globulin: 4.8 g/dL — ABNORMAL HIGH (ref 2.0–4.0)
Glucose: 91 mg/dL (ref 65–100)
Potassium: 3.8 mmol/L (ref 3.5–5.1)
Protein, total: 9 g/dL — ABNORMAL HIGH (ref 6.4–8.2)
Sodium: 135 mmol/L — ABNORMAL LOW (ref 136–145)

## 2015-03-01 LAB — HCG URINE, QL. - POC: Pregnancy test,urine (POC): POSITIVE — AB

## 2015-03-01 LAB — BILIRUBIN, CONFIRM: Bilirubin UA, confirm: NEGATIVE

## 2015-03-01 LAB — KOH, OTHER SOURCES: KOH: NONE SEEN

## 2015-03-01 MED ORDER — SODIUM CHLORIDE 0.9 % IJ SYRG
INTRAMUSCULAR | Status: DC | PRN
Start: 2015-03-01 — End: 2015-03-01

## 2015-03-01 MED ORDER — SODIUM CHLORIDE 0.9 % IJ SYRG
Freq: Three times a day (TID) | INTRAMUSCULAR | Status: DC
Start: 2015-03-01 — End: 2015-03-01

## 2015-03-01 MED ORDER — CEPHALEXIN 500 MG CAP
500 mg | ORAL_CAPSULE | Freq: Four times a day (QID) | ORAL | 0 refills | Status: AC
Start: 2015-03-01 — End: 2015-03-08

## 2015-03-01 MED ORDER — SODIUM CHLORIDE 0.9% BOLUS IV
0.9 % | INTRAVENOUS | Status: AC
Start: 2015-03-01 — End: 2015-03-01
  Administered 2015-03-01: 05:00:00 via INTRAVENOUS

## 2015-03-01 MED ORDER — FAMOTIDINE (PF) 20 MG/2 ML IV
20 mg/2 mL | INTRAVENOUS | Status: AC
Start: 2015-03-01 — End: 2015-02-28
  Administered 2015-03-01: 05:00:00 via INTRAVENOUS

## 2015-03-01 MED ORDER — METRONIDAZOLE 0.75 % VAGINAL GEL
0.75 % (37.5mg/5 gram) | Freq: Two times a day (BID) | VAGINAL | 0 refills | Status: AC
Start: 2015-03-01 — End: 2015-03-06

## 2015-03-01 MED ORDER — ONDANSETRON 4 MG TAB, RAPID DISSOLVE
4 mg | ORAL_TABLET | Freq: Three times a day (TID) | ORAL | 0 refills | Status: DC | PRN
Start: 2015-03-01 — End: 2017-08-11

## 2015-03-01 MED ORDER — ONDANSETRON (PF) 4 MG/2 ML INJECTION
4 mg/2 mL | INTRAMUSCULAR | Status: AC
Start: 2015-03-01 — End: 2015-02-28
  Administered 2015-03-01: 05:00:00 via INTRAVENOUS

## 2015-03-01 MED FILL — SODIUM CHLORIDE 0.9 % IV: INTRAVENOUS | Qty: 1000

## 2015-03-01 MED FILL — ONDANSETRON (PF) 4 MG/2 ML INJECTION: 4 mg/2 mL | INTRAMUSCULAR | Qty: 2

## 2015-03-01 MED FILL — FAMOTIDINE (PF) 20 MG/2 ML IV: 20 mg/2 mL | INTRAVENOUS | Qty: 2

## 2015-03-01 NOTE — ED Notes (Signed)
Dr. O'Bier has reviewed discharge instructions with the patient.  The patient verbalized understanding. Patient ambulatory to car, appears in NAD.

## 2015-03-01 NOTE — ED Notes (Signed)
Patient to US.

## 2015-03-01 NOTE — ED Notes (Signed)
Patient given popsicle for PO challenge, patient reports that she had a little bit of cramping but that it feels better.

## 2015-03-01 NOTE — ED Notes (Signed)
Patient back from US, resting comfortably in stretcher. Patient reports that her nausea has eased since being medicated.

## 2015-03-02 LAB — CULTURE, URINE
Colonies Counted: 25000
Colony Count: 25000

## 2015-04-24 LAB — HIV-1 AB, EXTERNAL
HIV, EXTERNAL: NONREACTIVE
HIV, External: NONREACTIVE

## 2015-04-24 LAB — TYPE, ABO & RH, EXTERNAL
ABO,Rh: O POS
TYPE, ABO & RH, EXTERNAL: O POS

## 2015-04-24 LAB — ANTIBODY SCREEN, EXTERNAL: Antibody screen, External: NEGATIVE

## 2015-04-24 LAB — HEPATITIS B SURFACE AG, EXTERNAL: HBsAg, External: NEGATIVE

## 2015-04-24 LAB — RPR, EXTERNAL: RPR, External: NONREACTIVE

## 2015-04-24 LAB — CHLAMYDIA DNA PROBE, EXTERNAL: Chlamydia, External: NEGATIVE

## 2015-04-24 LAB — RUBELLA AB, IGG, EXTERNAL: Rubella, External: IMMUNE

## 2015-04-24 LAB — N GONORRHOEAE, DNA PROBE, EXTERNAL: Gonorrhea, External: NEGATIVE

## 2015-07-09 IMAGING — CR DG CHEST 2V
2 series · 2 of 2 positions shown · non-contrast
Comparison: None.

CLINICAL DATA: Chest pain for 1 week.

EXAM:
CHEST  2 VIEW

[w chest pa]
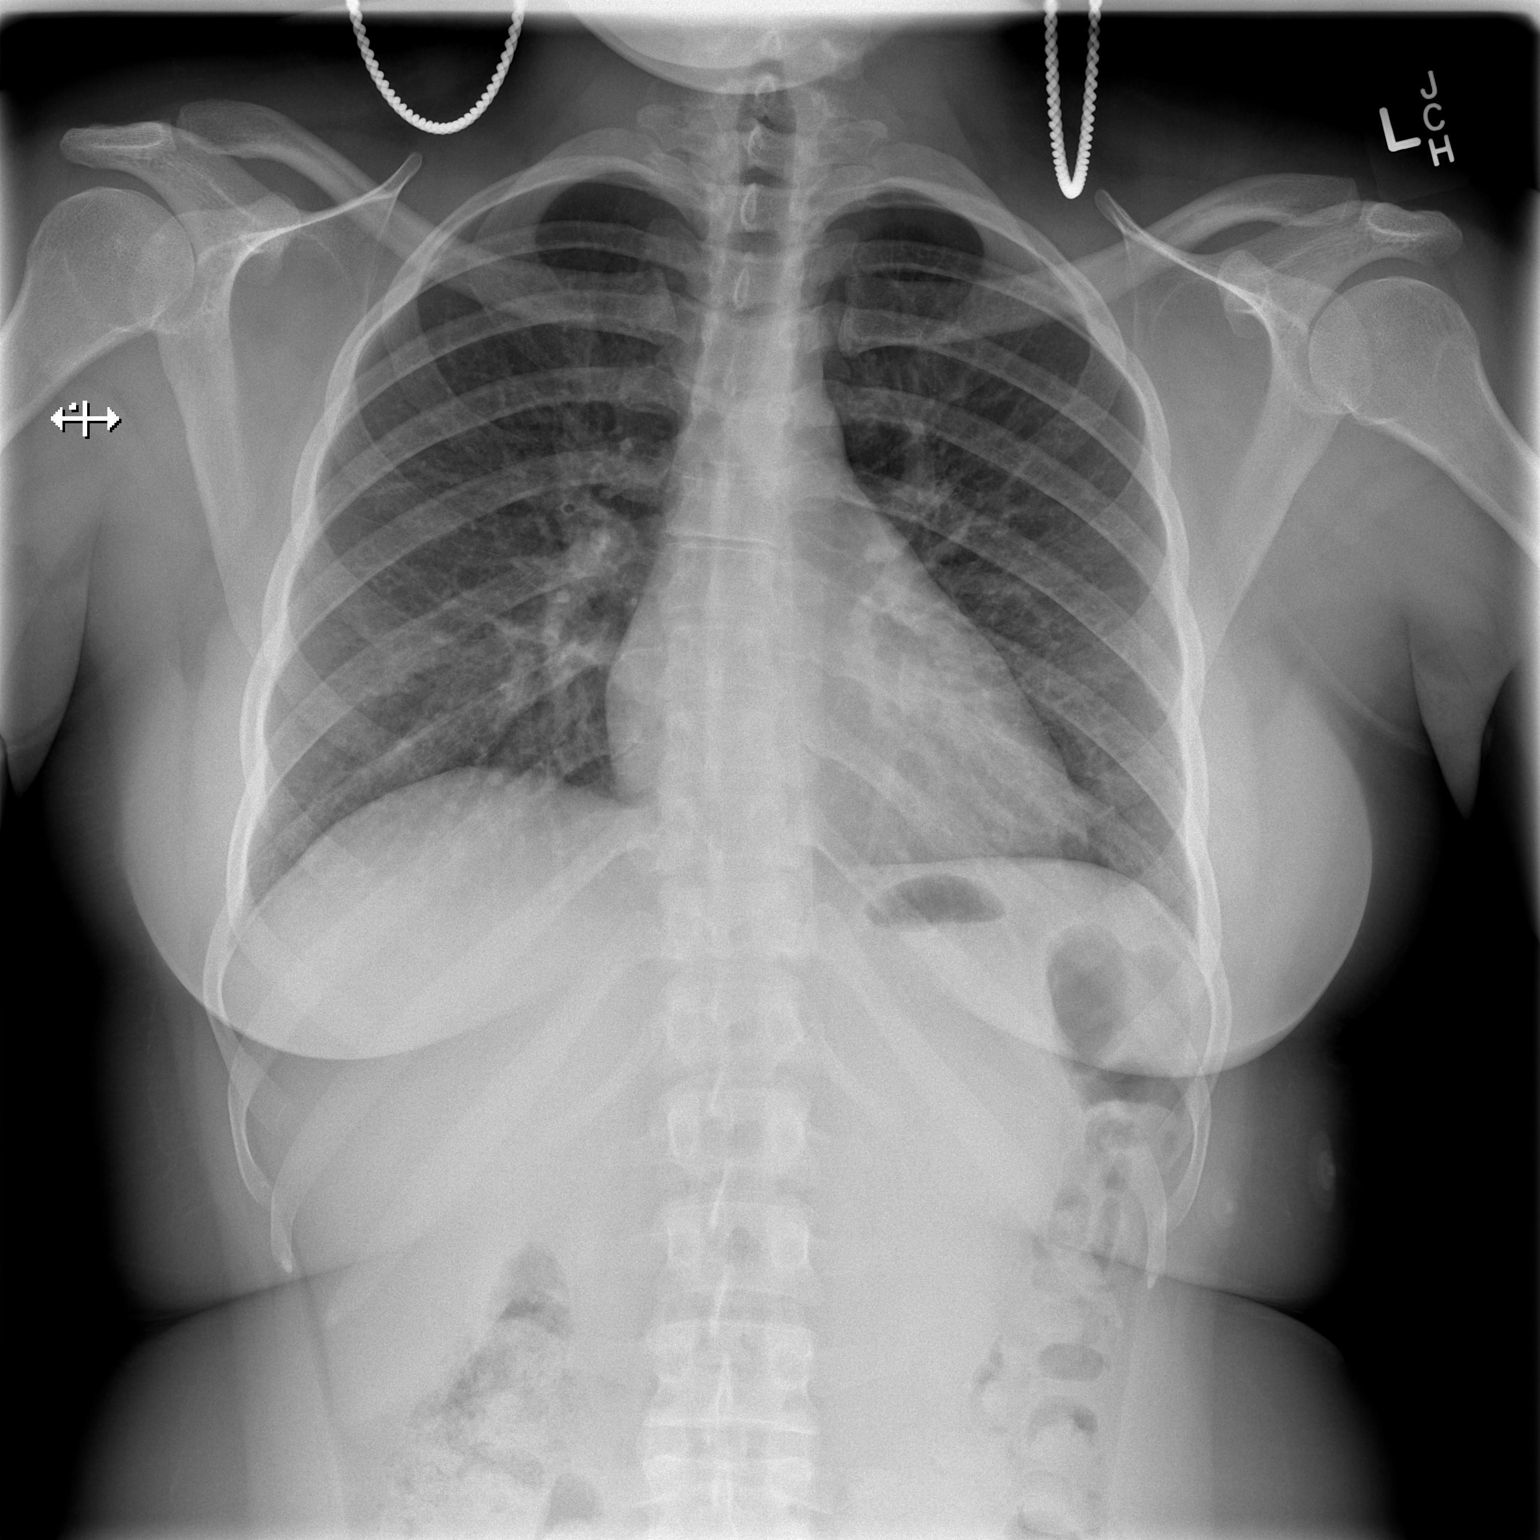

[w chest lat]
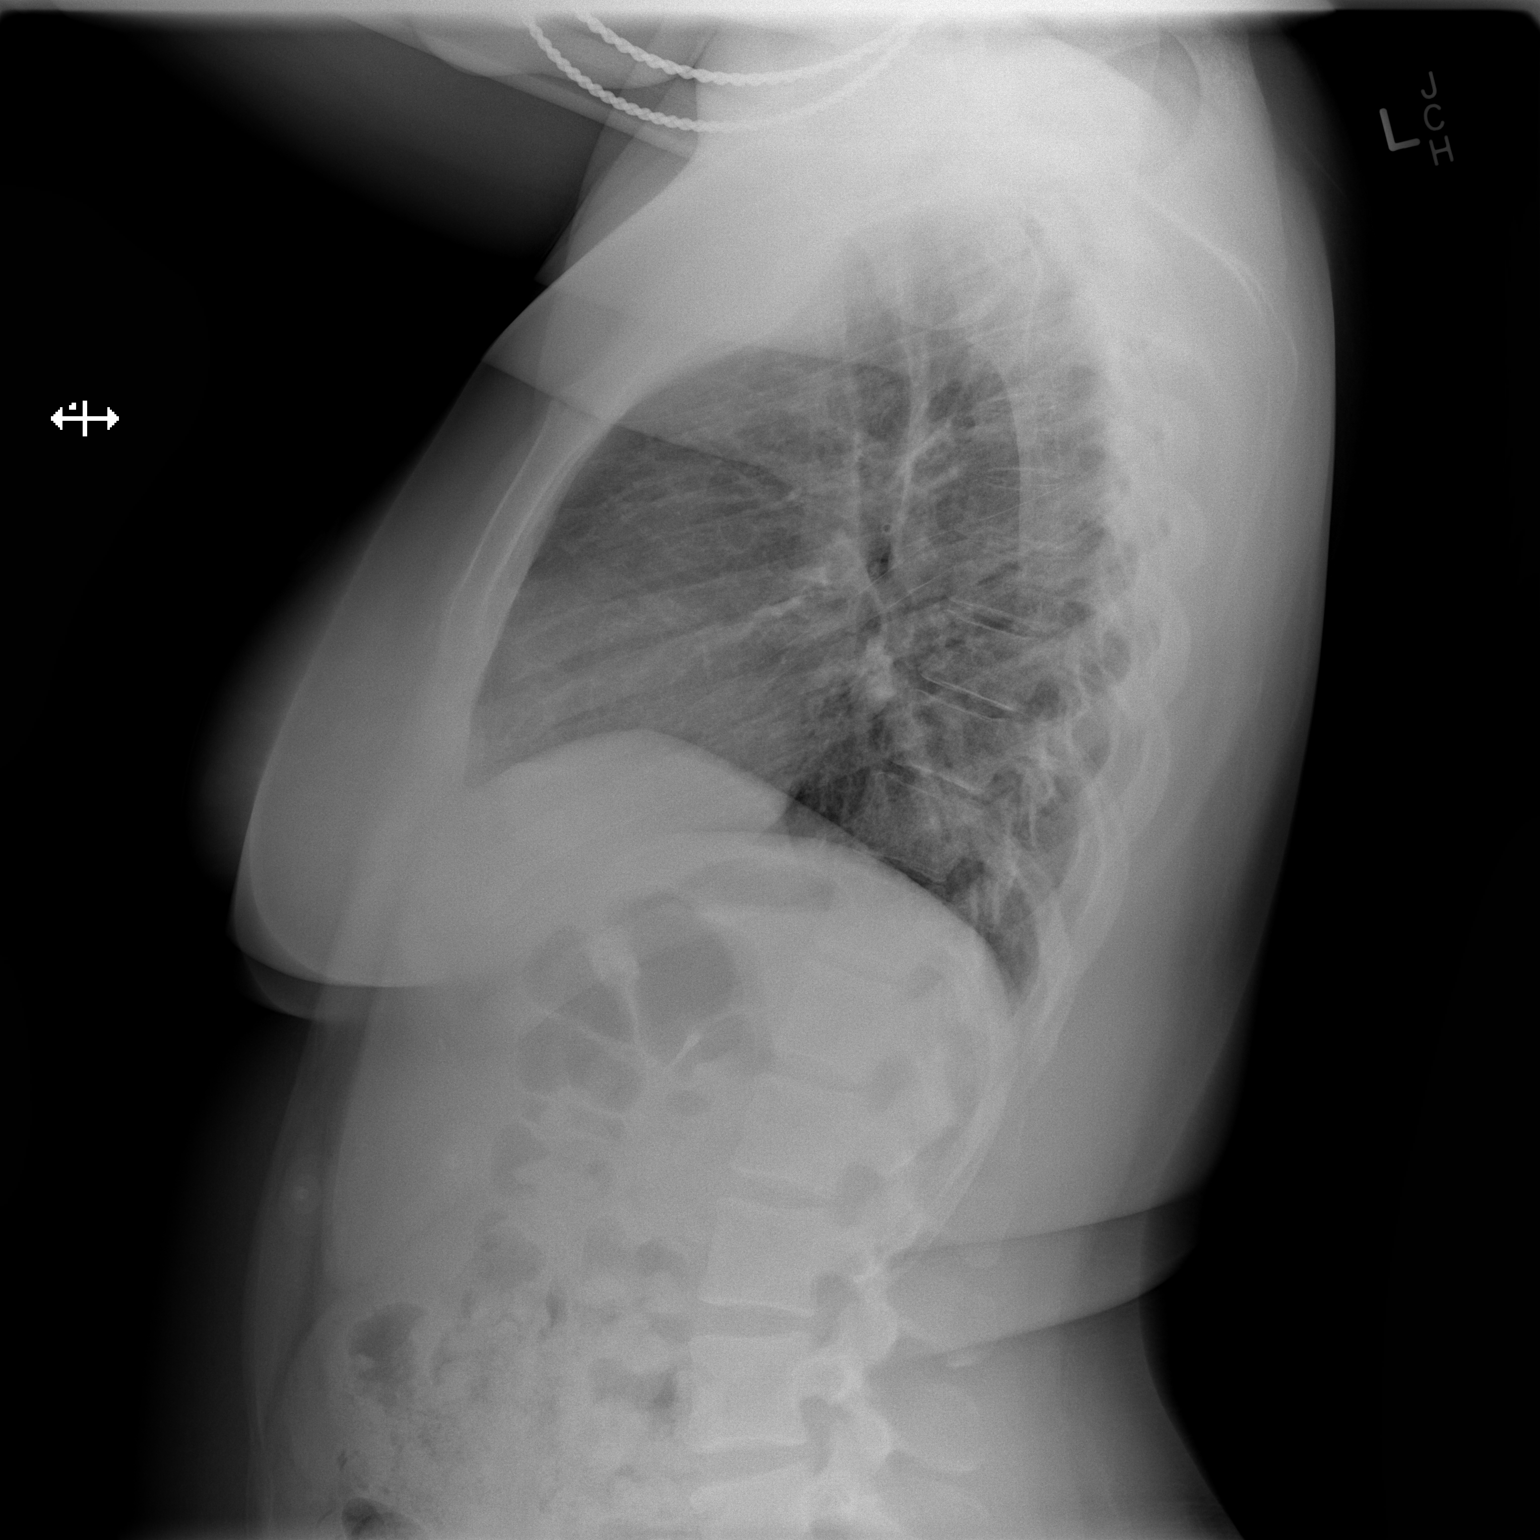

[2 of 2 positions shown; findings below may reference images not displayed]

FINDINGS: The heart size and mediastinal contours are within normal limits.
Both lungs are clear. The visualized skeletal structures are
unremarkable.
IMPRESSION: No significant abnormality.

## 2015-09-11 LAB — GYN RAPID GP B STREP, EXTERNAL: GrBStrep, External: NEGATIVE

## 2015-09-11 IMAGING — US US OB TRANSVAGINAL
1 series · 13 of 28 positions shown · non-contrast
Comparison: None.

CLINICAL DATA: Spotting during pregnancy. Estimated gestational age
by LMP is 8 weeks 6 days. Quantitative beta HCG is 69.

EXAM:
OBSTETRIC <14 WK US AND TRANSVAGINAL OB US
TECHNIQUE: Both transabdominal and transvaginal ultrasound examinations were
performed for complete evaluation of the gestation as well as the
maternal uterus, adnexal regions, and pelvic cul-de-sac.
Transvaginal technique was performed to assess early pregnancy.

[Series 1: us ob transvaginal · 0.17mm/px · 13 of 59 slices shown]
[im 3/59]
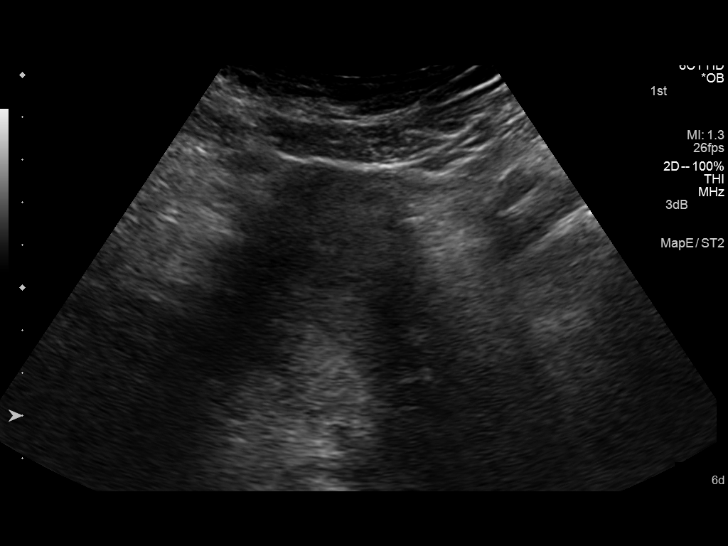
[im 7/59]
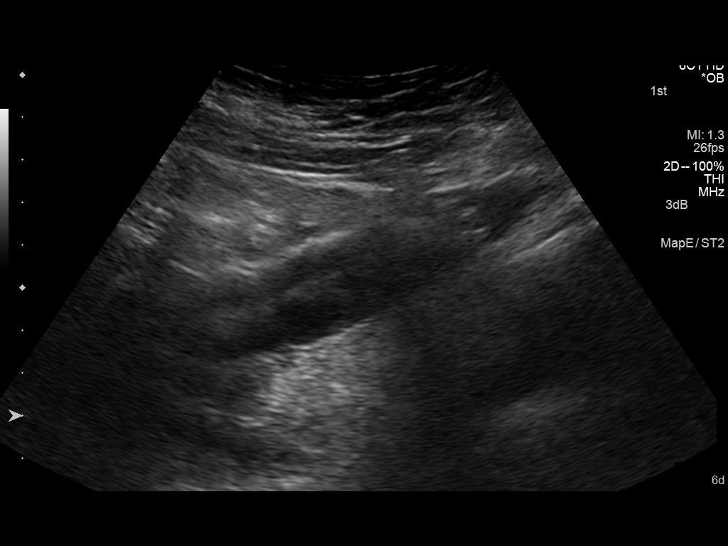
[im 11/59]
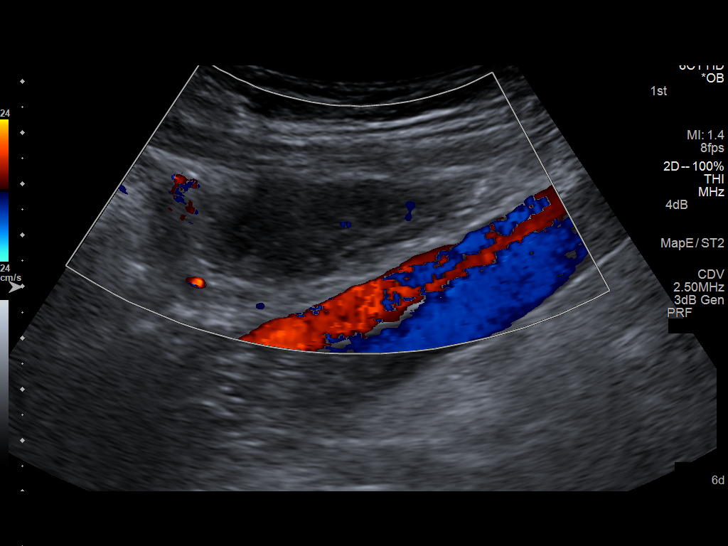
[im 16/59]
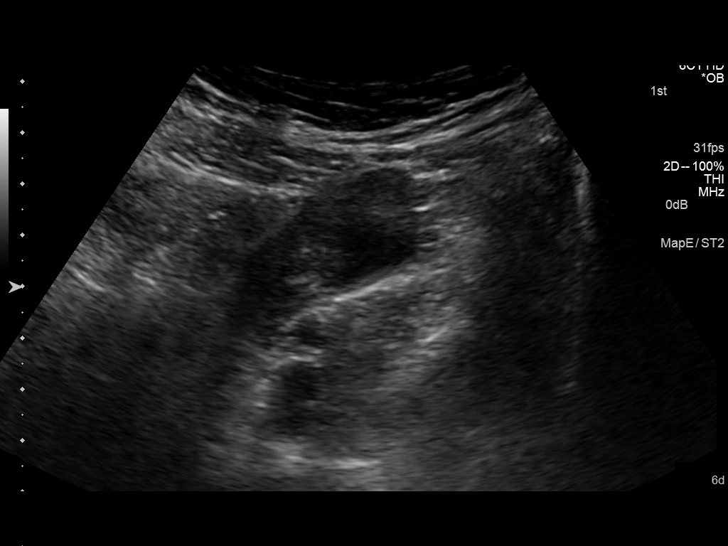
[im 20/59]
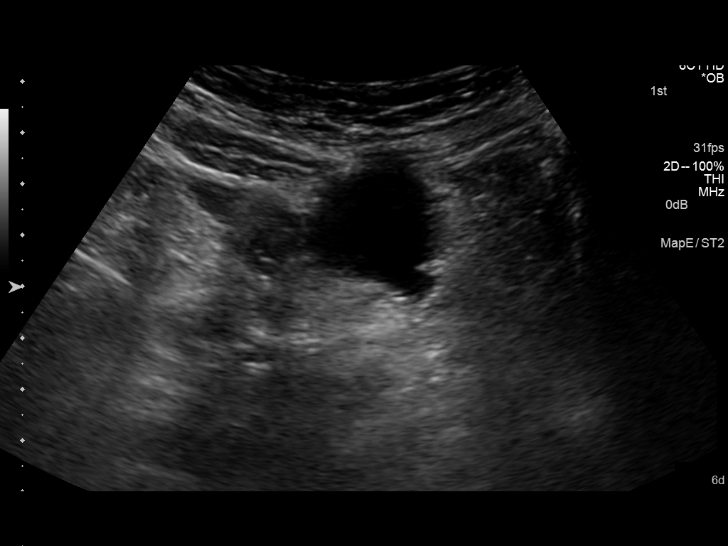
[im 24/59]
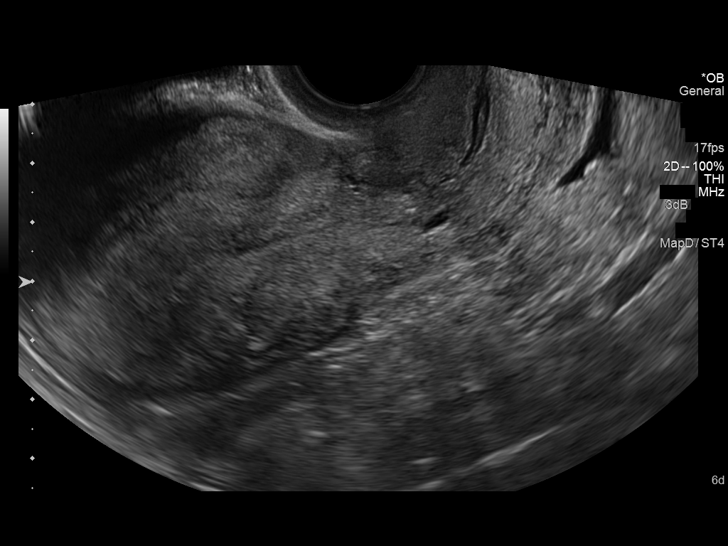
[im 31/59]
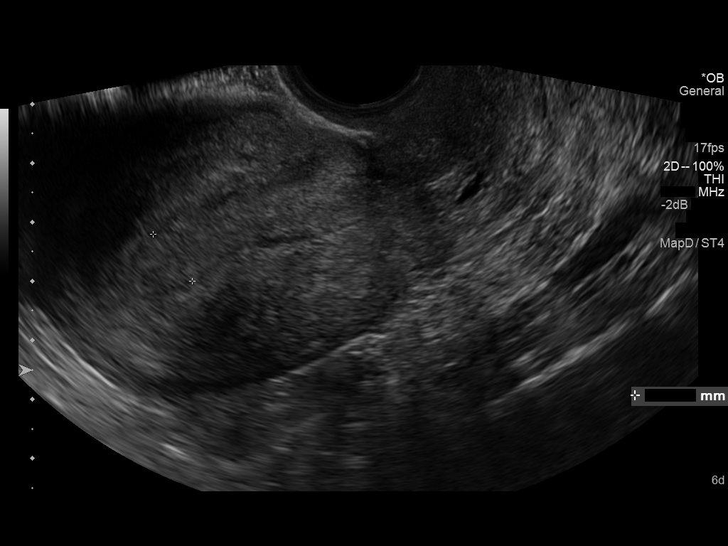
[im 35/59]
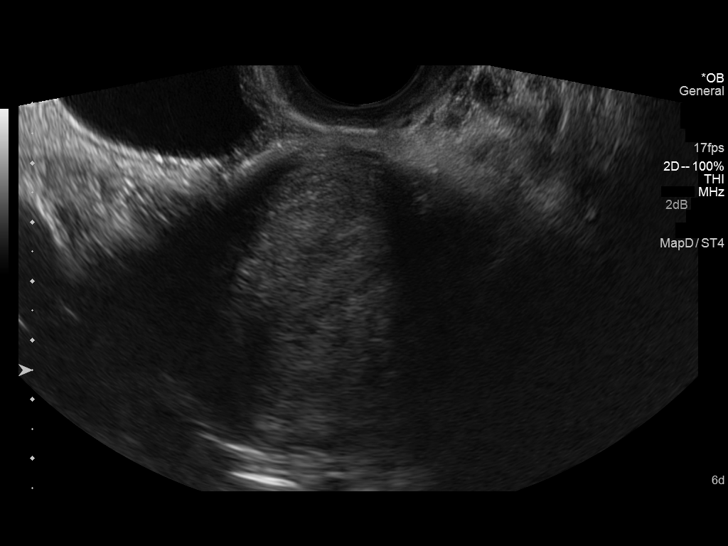
[im 39/59]
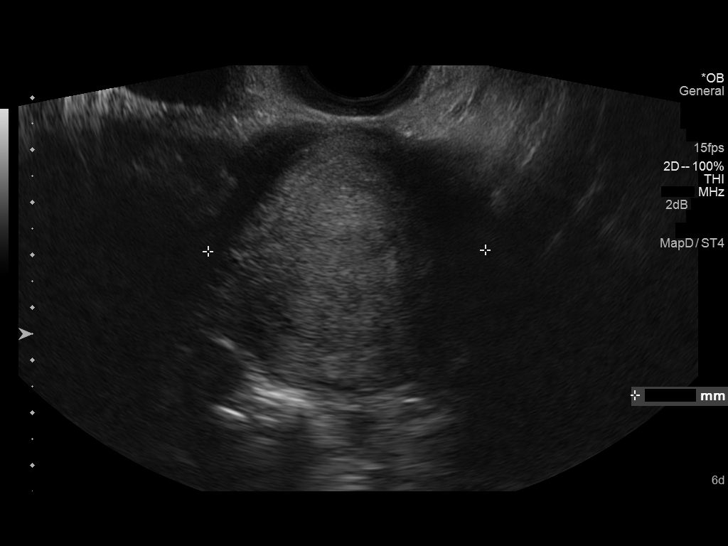
[im 43/59]
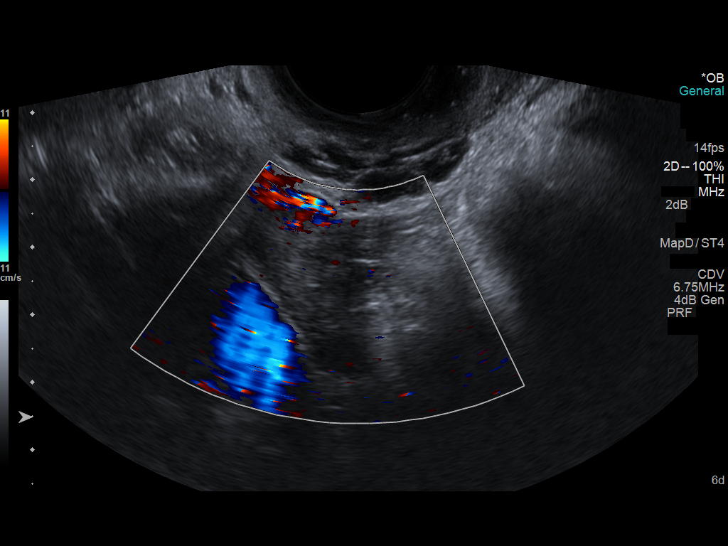
[im 48/59]
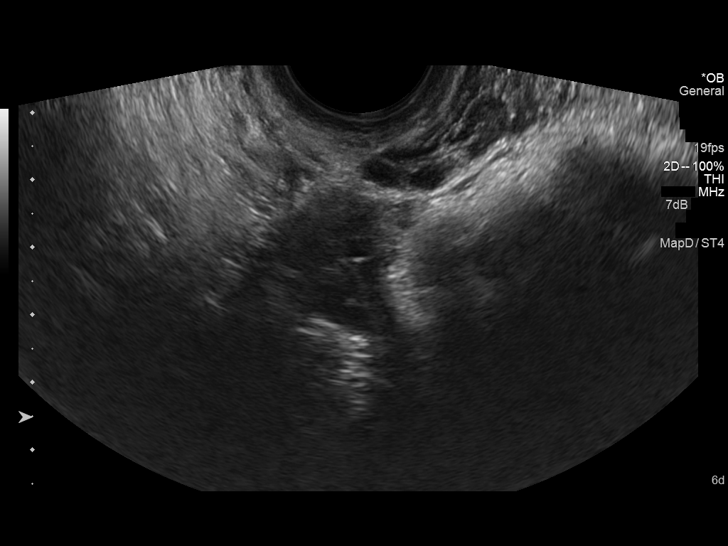
[im 52/59]
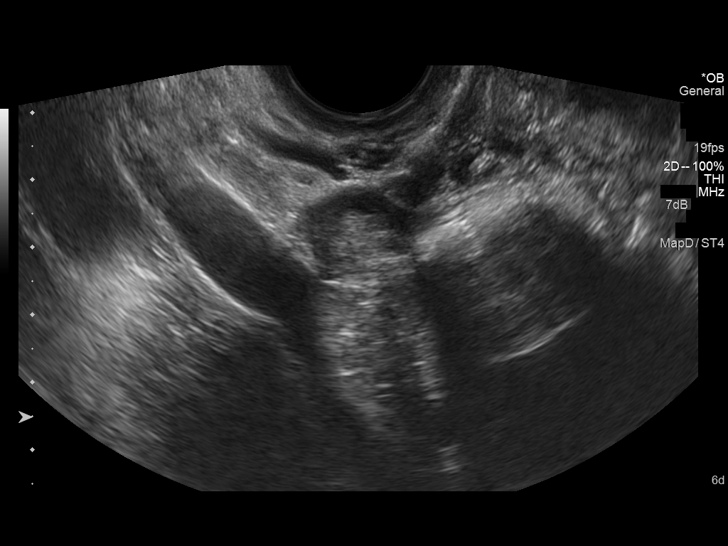
[im 56/59]
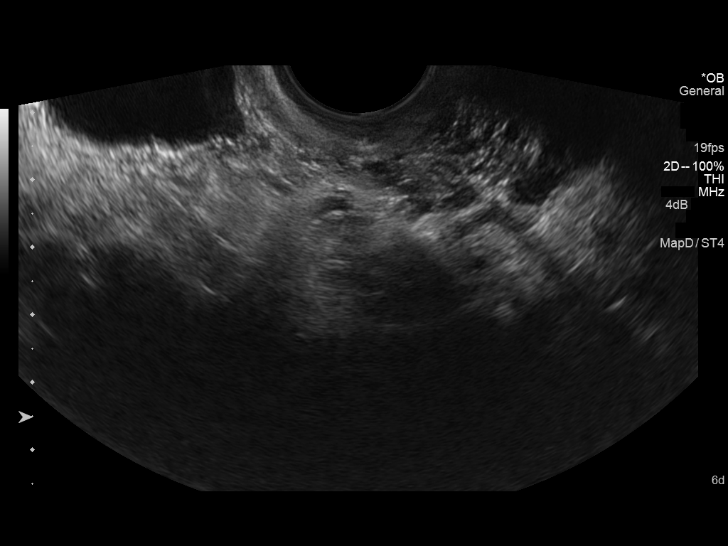

[13 of 28 positions shown; findings below may reference images not displayed]

FINDINGS: Intrauterine gestational sac: No intrauterine gestational sac is
identified.

Yolk sac:  Not identified.

Embryo:  Not identified.

Cardiac Activity: Not identified.

Maternal uterus/adnexae: Uterus is anteverted. Uterus measures
x 4.8 x 5.3 cm. No myometrial mass lesions demonstrated. Small
nabothian cysts in the cervix. Endometrium is somewhat prominent.
Endometrial stripe thickness measures 10 mm. No endometrial fluid
collections are demonstrated. Both ovaries are visualized and appear
normal. Right ovary measures 2.9 x 1.7 x 2.5 cm. Left ovary measures
6.4 x 2.5 x 4.1 cm. Corpus luteal cyst on the left measuring 3 cm
maximal diameter. No free pelvic fluid collections are demonstrated.
IMPRESSION: No intrauterine gestational sac, yolk sac, or fetal pole identified.
Differential considerations include intrauterine pregnancy too early
to be sonographically visualized, missed abortion, or ectopic
pregnancy. Followup ultrasound is recommended in 10-14 days for
further evaluation.

## 2015-09-27 ENCOUNTER — Inpatient Hospital Stay
Admit: 2015-09-27 | Discharge: 2015-09-30 | Disposition: A | Discharge status: Home / Self Care | Payer: PRIVATE HEALTH INSURANCE | Attending: Specialist | Admitting: Specialist

## 2015-09-27 DIAGNOSIS — O34211 Maternal care for low transverse scar from previous cesarean delivery: Secondary | ICD-10-CM

## 2015-09-27 LAB — TYPE AND SCREEN
ABO/Rh: O POS
Antibody Screen: NEGATIVE

## 2015-09-27 LAB — CBC WITH AUTOMATED DIFF
ABS. BASOPHILS: 0 10*3/uL (ref 0.0–0.1)
ABS. EOSINOPHILS: 0.1 10*3/uL (ref 0.0–0.4)
ABS. LYMPHOCYTES: 2 10*3/uL (ref 0.8–3.5)
ABS. MONOCYTES: 0.1 10*3/uL (ref 0.0–1.0)
ABS. NEUTROPHILS: 8.2 10*3/uL — ABNORMAL HIGH (ref 1.8–8.0)
BASOPHILS: 0 % (ref 0–1)
EOSINOPHILS: 1 % (ref 0–7)
HCT: 31.7 % — ABNORMAL LOW (ref 35.0–47.0)
HGB: 11 g/dL — ABNORMAL LOW (ref 11.5–16.0)
LYMPHOCYTES: 19 % (ref 12–49)
MCH: 31.5 PG (ref 26.0–34.0)
MCHC: 34.7 g/dL (ref 30.0–36.5)
MCV: 90.8 FL (ref 80.0–99.0)
MONOCYTES: 1 % — ABNORMAL LOW (ref 5–13)
NEUTROPHILS: 79 % — ABNORMAL HIGH (ref 32–75)
PLATELET: 258 10*3/uL (ref 150–400)
RBC: 3.49 M/uL — ABNORMAL LOW (ref 3.80–5.20)
RDW: 13.9 % (ref 11.5–14.5)
WBC: 10.4 10*3/uL (ref 3.6–11.0)

## 2015-09-27 LAB — TYPE & SCREEN
ABO/Rh(D): O POS
Antibody screen: NEGATIVE

## 2015-09-27 MED ORDER — MEASLES,MUMPS&RUBELLA VACCIN(PF) 1,000-12,500 TCID50/0.5 ML SUB-Q SUSP
1000-12500 TCID50/0.5 mL | SUBCUTANEOUS | Status: DC
Start: 2015-09-27 — End: 2015-09-30

## 2015-09-27 MED ORDER — SODIUM CHLORIDE 0.9 % IV
10 mg/mL | INTRAVENOUS | Status: DC | PRN
Start: 2015-09-27 — End: 2015-09-27
  Administered 2015-09-27 (×2): via INTRAVENOUS

## 2015-09-27 MED ORDER — OXYTOCIN 10 UNIT/ML INJECTION
10 unit/mL | INTRAMUSCULAR | Status: AC
Start: 2015-09-27 — End: ?

## 2015-09-27 MED ORDER — BUPIVACAINE (PF) 0.75 % (7.5 MG/ML) IJ SOLN
0.75 % (7.5 mg/mL) | INTRAMUSCULAR | Status: DC | PRN
Start: 2015-09-27 — End: 2015-09-27
  Administered 2015-09-27: 16:00:00 via INTRATHECAL

## 2015-09-27 MED ORDER — RHO D IMMUNE GLOBULIN 300 MCG IM SYRG
1500 unit (300 mcg) | Freq: Once | INTRAMUSCULAR | Status: DC | PRN
Start: 2015-09-27 — End: 2015-09-30

## 2015-09-27 MED ORDER — ONDANSETRON (PF) 4 MG/2 ML INJECTION
4 mg/2 mL | INTRAMUSCULAR | Status: DC | PRN
Start: 2015-09-27 — End: 2015-09-30

## 2015-09-27 MED ORDER — DIPHENHYDRAMINE HCL 50 MG/ML IJ SOLN
50 mg/mL | INTRAMUSCULAR | Status: DC | PRN
Start: 2015-09-27 — End: 2015-09-30

## 2015-09-27 MED ORDER — LACTATED RINGERS IV
INTRAVENOUS | Status: DC
Start: 2015-09-27 — End: 2015-09-27
  Administered 2015-09-27: 15:00:00 via INTRAVENOUS

## 2015-09-27 MED ORDER — SIMETHICONE 80 MG CHEWABLE TAB
80 mg | ORAL | Status: DC | PRN
Start: 2015-09-27 — End: 2015-09-30

## 2015-09-27 MED ORDER — MORPHINE (PF) 1 MG/ML INJECTION
1 mg/mL | INTRAMUSCULAR | Status: AC
Start: 2015-09-27 — End: ?

## 2015-09-27 MED ORDER — ACETAMINOPHEN 1,000 MG/100 ML (10 MG/ML) IV
1000 mg/100 mL (10 mg/mL) | INTRAVENOUS | Status: AC
Start: 2015-09-27 — End: ?

## 2015-09-27 MED ORDER — IBUPROFEN 400 MG TAB
400 mg | Freq: Three times a day (TID) | ORAL | Status: DC
Start: 2015-09-27 — End: 2015-09-30
  Administered 2015-09-28 – 2015-09-30 (×7): via ORAL

## 2015-09-27 MED ORDER — KETOROLAC TROMETHAMINE 30 MG/ML INJECTION
30 mg/mL (1 mL) | Freq: Four times a day (QID) | INTRAMUSCULAR | Status: AC | PRN
Start: 2015-09-27 — End: 2015-09-29

## 2015-09-27 MED ORDER — SODIUM CHLORIDE 0.9 % IJ SYRG
Freq: Three times a day (TID) | INTRAMUSCULAR | Status: DC
Start: 2015-09-27 — End: 2015-09-30

## 2015-09-27 MED ORDER — NALOXONE 0.4 MG/ML INJECTION
0.4 mg/mL | INTRAMUSCULAR | Status: DC | PRN
Start: 2015-09-27 — End: 2015-09-30

## 2015-09-27 MED ORDER — OXYCODONE-ACETAMINOPHEN 7.5 MG-325 MG TAB
ORAL | Status: DC | PRN
Start: 2015-09-27 — End: 2015-09-30
  Administered 2015-09-28 – 2015-09-30 (×8): via ORAL

## 2015-09-27 MED ORDER — IBUPROFEN 600 MG TAB
600 mg | Freq: Four times a day (QID) | ORAL | Status: DC | PRN
Start: 2015-09-27 — End: 2015-09-30

## 2015-09-27 MED ORDER — LACTATED RINGERS IV
INTRAVENOUS | Status: DC
Start: 2015-09-27 — End: 2015-09-30
  Administered 2015-09-27 – 2015-09-28 (×3): via INTRAVENOUS

## 2015-09-27 MED ORDER — ACETAMINOPHEN 1,000 MG/100 ML (10 MG/ML) IV
1000 mg/100 mL (10 mg/mL) | INTRAVENOUS | Status: DC | PRN
Start: 2015-09-27 — End: 2015-09-27
  Administered 2015-09-27: 16:00:00 via INTRAVENOUS

## 2015-09-27 MED ORDER — EPHEDRINE SULFATE 50 MG/ML IJ SOLN
50 mg/mL | INTRAMUSCULAR | Status: AC
Start: 2015-09-27 — End: ?

## 2015-09-27 MED ORDER — OXYTOCIN 20 UNITS/1000 ML IN LACTATED RINGERS IV
20 unit/1,000 mL | Freq: Once | INTRAVENOUS | Status: AC
Start: 2015-09-27 — End: 2015-09-28

## 2015-09-27 MED ORDER — SODIUM CHLORIDE 0.9 % IJ SYRG
INTRAMUSCULAR | Status: DC | PRN
Start: 2015-09-27 — End: 2015-09-30

## 2015-09-27 MED ORDER — ONDANSETRON (PF) 4 MG/2 ML INJECTION
4 mg/2 mL | INTRAMUSCULAR | Status: AC
Start: 2015-09-27 — End: ?

## 2015-09-27 MED ORDER — PHENYLEPHRINE IN 0.9 % SODIUM CL (40 MCG/ML) IV SYRINGE
0.4 mg/10 mL (40 mcg/mL) | INTRAVENOUS | Status: AC
Start: 2015-09-27 — End: ?

## 2015-09-27 MED ORDER — SODIUM CHLORIDE 0.9 % IJ SYRG
INTRAMUSCULAR | Status: DC | PRN
Start: 2015-09-27 — End: 2015-09-27

## 2015-09-27 MED ORDER — CEFAZOLIN 2 G IN 100 ML 0.9% NS
2 gram/100 mL | Freq: Once | INTRAVENOUS | Status: AC
Start: 2015-09-27 — End: 2015-09-27
  Administered 2015-09-27: 16:00:00 via INTRAVENOUS

## 2015-09-27 MED ORDER — SODIUM CHLORIDE 0.9 % IJ SYRG
Freq: Three times a day (TID) | INTRAMUSCULAR | Status: DC
Start: 2015-09-27 — End: 2015-09-27

## 2015-09-27 MED ORDER — LACTATED RINGERS IV
INTRAVENOUS | Status: DC | PRN
Start: 2015-09-27 — End: 2015-09-27
  Administered 2015-09-27: 16:00:00 via INTRAVENOUS

## 2015-09-27 MED ORDER — ONDANSETRON (PF) 4 MG/2 ML INJECTION
4 mg/2 mL | INTRAMUSCULAR | Status: DC | PRN
Start: 2015-09-27 — End: 2015-09-27
  Administered 2015-09-27: 16:00:00 via INTRAVENOUS

## 2015-09-27 MED ORDER — OXYTOCIN 20 UNITS/1000 ML IN LACTATED RINGERS IV
20 unit/1,000 mL | INTRAVENOUS | Status: DC | PRN
Start: 2015-09-27 — End: 2015-09-27
  Administered 2015-09-27: 16:00:00 via INTRAVENOUS

## 2015-09-27 MED ORDER — MORPHINE (PF) 0.5 MG/ML IJ SOLN
0.5 mg/mL | INTRAMUSCULAR | Status: DC | PRN
Start: 2015-09-27 — End: 2015-09-27
  Administered 2015-09-27: 16:00:00 via INTRATHECAL

## 2015-09-27 MED FILL — ONDANSETRON (PF) 4 MG/2 ML INJECTION: 4 mg/2 mL | INTRAMUSCULAR | Qty: 2

## 2015-09-27 MED FILL — PHENYLEPHRINE IN 0.9 % SODIUM CL (40 MCG/ML) IV SYRINGE: 0.4 mg/10 mL (40 mcg/mL) | INTRAVENOUS | Qty: 10

## 2015-09-27 MED FILL — OFIRMEV 1,000 MG/100 ML (10 MG/ML) INTRAVENOUS SOLUTION: 1000 mg/100 mL (10 mg/mL) | INTRAVENOUS | Qty: 100

## 2015-09-27 MED FILL — LACTATED RINGERS IV: INTRAVENOUS | Qty: 1000

## 2015-09-27 MED FILL — CEFAZOLIN 2 G IN 100 ML 0.9% NS: 2 gram/100 mL | INTRAVENOUS | Qty: 100

## 2015-09-27 MED FILL — ASTRAMORPH-PF 1 MG/ML INJECTION SOLUTION: 1 mg/mL | INTRAMUSCULAR | Qty: 2

## 2015-09-27 MED FILL — EPHEDRINE SULFATE 50 MG/ML IJ SOLN: 50 mg/mL | INTRAMUSCULAR | Qty: 1

## 2015-09-27 MED FILL — OXYTOCIN 10 UNIT/ML INJECTION: 10 unit/mL | INTRAMUSCULAR | Qty: 2

## 2015-09-27 NOTE — Progress Notes (Signed)
1159-Patient off monitor to OR, FHR 161 BPM    1207-FHR in OR 146 BPM    1240-Patient to room 3316 via stretcher

## 2015-09-27 NOTE — Anesthesia Procedure Notes (Signed)
Spinal Block    Start time: 09/27/2015 12:04 PM  End time: 09/27/2015 12:06 PM  Performed by: Evelynne Spiers M  Authorized by: Jakari Jacot M     Pre-procedure:  Indications: primary anesthetic  Preanesthetic Checklist: risks and benefits discussed, site marked and timeout performed    Timeout Time: 12:04          Spinal Block:   Patient Position:  Seated  Prep Region:  Lumbar  Prep: DuraPrep      Location:  L3-4  Technique:  Single shot    Local Dose (mL):  3    Needle:   Needle Type:  Pencil-tip  Needle Gauge:  25 G  Attempts:  1      Events: CSF confirmed and no blood with aspiration        Assessment:  Insertion:  Uncomplicated  Patient tolerance:  Patient tolerated the procedure well with no immediate complications  1.5 mL 0.75% Spinal Marcaine with dextrose + 0.5 mg Duramorph was deposited into the CSF.

## 2015-09-27 NOTE — Progress Notes (Signed)
Whitney Bell is a 30 year old G4P2 who arrived to room 3176 A for repeat c section.   Patient seen by Dr. Rosine DoorEspinosa prenatally.  Pt has a hx of one SAB, two previous c sections, chlamydia, anemia, asthma and HSV.  Patient states positive fetal movement, denies contractions, leaking fluids, vaginal bleeding, headache or blurred vision  EFM and TOCO applied and pt oriented to room. POC discussed.  Consents reviewed and signed.  Dr. Bethena MidgetMead is aware of pt's arrival and status and orders pending.

## 2015-09-27 NOTE — Op Note (Signed)
[facility]   [address]   [place], [place] [id number]   OP NOTE       Name:  Whitney Bell, Whitney Bell   MR#:  [id number]   DOB:  19-Dec-1985   Account #:  [id number]    Surgery Date:  09/27/2015   Date of Adm:  09/27/2015       PREOPERATIVE DIAGNOSIS: Term pregnancy, desires repeat   cesarean section.    POSTOPERATIVE DIAGNOSIS: Term pregnancy, desires repeat   cesarean section.    PROCEDURES PERFORMED: Repeat low transverse cesarean   section.    SURGEON: Gilda Creasehomas P. Jasmaine Rochel, MD     ANESTHESIA: Spinal.    FINDINGS: There was delivery of a viable female infant. Uterus, tubes,   ovaries and placenta all appeared grossly normal.    ESTIMATED BLOOD LOSS: 700 mL.    DESCRIPTION OF PROCEDURE: The patient was taken to the   operating room, placed on the operating room table. After adequate   anesthesia was obtained, she was placed in the left lateral tilt position.   A Foley catheter was placed. The abdomen was prepped and the   patient was draped in the usual sterile fashion.     A low transverse incision was made on the skin with a knife and   carried down to the fascia with the knife. The fascia was nicked in the   midline and the fascial incision was extended bilaterally using blunt   dissection. The rectus muscles were separated sharply in the midline   and the peritoneum was entered sharp ligament. A bladder flap was   created and a low transverse incision was made on the uterus with   Metzenbaum scissors. This incision was extended bilaterally using   blunt dissection. The head was delivered without difficulty as well as   the shoulders and the rest of the body. The cord was clamped and cut   and the infant was handed to awaiting personnel. The placenta was   delivered manually with findings as above.     The uterus was exteriorized and the uterine cavity was swept with a   moist laparotomy sponge. The uterine incision was closed using a    running interlocking suture of 0 Vicryl. There was bleeding left of the   midline that was controlled with a figure-of-eight suture of 0 Vicryl. The   uterus was placed back into the pelvis and Hemostasis was assured.   Attention was turned to the fascia which was closed with a running   suture of #1 Vicryl. The subcutaneous layer was irrigated and bleeders   were controlled using the Bovie. The skin was closed using staples   and a sterile dressing was applied. There was clear urine draining from   the Foley at the end of the procedure. All sponge, needle and   instrument counts were correct x2. The patient was taken to the post-  anesthesia care unit in satisfactory condition.    SPECIMENS REMOVED: Pathology: None.        Gilda CreaseHOMAS P. Marcheta Horsey, MD      TPM / RLD   D:  09/27/2015   14:26   T:  09/27/2015   14:47   Job #:  [id number]

## 2015-09-27 NOTE — Lactation Note (Signed)
Discussed with mother her plan for feeding.  Reviewed the benefits of exclusive breast milk feeding during the hospital stay.  Informed mother of the risks of using formula to supplement in the first few days of life as well as the benefits of successful breast milk feeding; referred mother to the handout in her admission packet related to these topics.  Mother acknowledges understanding of information reviewed and states that it is her plan to formula feed exclusively her infant.  Will support her choice and offer additional information as needed.

## 2015-09-27 NOTE — Other (Signed)
TRANSFER - OUT REPORT:    Verbal report given to C Rollins RN(name) on Shonna ChockKeiona J Fout  being transferred to MIU(unit) for routine post - op       Report consisted of patient???s Situation, Background, Assessment and   Recommendations(SBAR).     Information from the following report(s) SBAR, Kardex, OR Summary, Procedure Summary, Intake/Output, MAR, Accordion, Recent Results and Med Rec Status was reviewed with the receiving nurse.    Lines:   Peripheral IV 09/27/15 Left Forearm (Active)   Site Assessment Clean, dry, & intact 09/27/2015 12:56 PM   Phlebitis Assessment 0 09/27/2015 12:56 PM   Infiltration Assessment 0 09/27/2015 12:56 PM   Dressing Status Clean, dry, & intact 09/27/2015 12:56 PM   Dressing Type Tape;Transparent 09/27/2015 12:56 PM   Hub Color/Line Status Pink;Infusing;Patent 09/27/2015 12:56 PM   Action Taken Blood drawn 09/27/2015 11:00 AM        Opportunity for questions and clarification was provided.      Patient transported with:   Registered Nurse

## 2015-09-27 NOTE — Anesthesia Post-Procedure Evaluation (Signed)
Post-Anesthesia Evaluation and Assessment    Patient: Whitney Bell MRN: [id number]  SSN: [id number]    Date of Birth: Jul 12, 1985  Age: 30 y.o.  Sex: female       Cardiovascular Function/Vital Signs  Visit Vitals   ??? BP 121/86   ??? Pulse 71   ??? Temp (!) 2.6 ??C (36.7 ??F)   ??? Resp 16   ??? Ht 5\' 3"  (1.6 m)   ??? Wt 82.1 kg (181 lb)   ??? SpO2 99%   ??? Breastfeeding No   ??? BMI 32.06 kg/m2       Patient is status post spinal anesthesia for Procedure(s):  CESAREAN SECTION.    Nausea/Vomiting: None    Postoperative hydration reviewed and adequate.    Pain:  Pain Scale 1: Numeric (0 - 10) (09/27/15 1100)  Pain Intensity 1: 0 (09/27/15 1100)   Managed    Neurological Status:   Neuro (WDL): Within Defined Limits (09/27/15 1100)   At baseline    Mental Status and Level of Consciousness: Arousable    Pulmonary Status:   O2 Device: Room air (09/27/15 1100)   Adequate oxygenation and airway patent    Complications related to anesthesia: None    Post-anesthesia assessment completed. No concerns    Signed By: Atilano Inaonan M Alayzia Pavlock, MD     September 27, 2015
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
## 2015-09-27 NOTE — Anesthesia Pre-Procedure Evaluation (Addendum)
Anesthetic History   No history of anesthetic complications            Review of Systems / Medical History  Patient summary reviewed, nursing notes reviewed and pertinent labs reviewed    Pulmonary          Smoker  Asthma : well controlled    Comments: Smoker - 0.5 ppd   Neuro/Psych              Cardiovascular                  Exercise tolerance: >4 METS     GI/Hepatic/Renal  Within defined limits              Endo/Other  Within defined limits           Other Findings   Comments: Genital HSV         Physical Exam    Airway  Mallampati: II  TM Distance: > 6 cm  Neck ROM: normal range of motion   Mouth opening: Normal     Cardiovascular  Regular rate and rhythm,  S1 and S2 normal,  no murmur, click, rub, or gallop             Dental  No notable dental hx       Pulmonary  Breath sounds clear to auscultation               Abdominal  GI exam deferred       Other Findings            Anesthetic Plan    ASA: 2  Anesthesia type: spinal            Anesthetic plan and risks discussed with: Patient

## 2015-09-27 NOTE — Other (Signed)
Bedside shift change report given to N. Schools,RN (Cabin crewoncoming nurse) by C. Harlin Heysollins, RN Physiological scientist(offgoing nurse). Report included the following information SBAR.

## 2015-09-27 NOTE — Op Note (Signed)
[facility]   [address]   [place], [place] [id number]   OP NOTE       Name:  Whitney Bell, Whitney Bell   MR#:  [id number]   DOB:  [birth year]   Account #:  [id number]    Surgery Date:  09/27/2015   Date of Adm:  09/27/2015       PREOPERATIVE DIAGNOSIS: Term pregnancy, desires repeat   cesarean section.    POSTOPERATIVE DIAGNOSIS: Term pregnancy, desires repeat   cesarean section.    PROCEDURES PERFORMED: Repeat low transverse cesarean   section.    SURGEON: Gilda Creasehomas P. Douglass Dunshee, MD     ANESTHESIA: Spinal.    FINDINGS: There was delivery of a viable female infant. Uterus, tubes,   ovaries and placenta all appeared grossly normal.    ESTIMATED BLOOD LOSS: 700 mL.    DESCRIPTION OF PROCEDURE: The patient was taken to the   operating room, placed on the operating room table. After adequate   anesthesia was obtained, she was placed in the left lateral tilt position.   A Foley catheter was placed. The abdomen was prepped and the   patient was draped in the usual sterile fashion.     A low transverse incision was made on the skin with a knife and   carried down to the fascia with the knife. The fascia was nicked in the   midline and the fascial incision was extended bilaterally using blunt   dissection. The rectus muscles were separated sharply in the midline   and the peritoneum was entered sharp ligament. A bladder flap was   created and a low transverse incision was made on the uterus with   Metzenbaum scissors. This incision was extended bilaterally using   blunt dissection. The head was delivered without difficulty as well as   the shoulders and the rest of the body. The cord was clamped and cut   and the infant was handed to awaiting personnel. The placenta was   delivered manually with findings as above.     The uterus was exteriorized and the uterine cavity was swept with a   moist laparotomy sponge. The uterine incision was closed using a   running interlocking suture of 0 Vicryl. There was  bleeding left of the   midline that was controlled with a figure-of-eight suture of 0 Vicryl. The   uterus was placed back into the pelvis and Hemostasis was assured.   Attention was turned to the fascia which was closed with a running   suture of #1 Vicryl. The subcutaneous layer was irrigated and bleeders   were controlled using the Bovie. The skin was closed using staples   and a sterile dressing was applied. There was clear urine draining from   the Foley at the end of the procedure. All sponge, needle and   instrument counts were correct x2. The patient was taken to the post-  anesthesia care unit in satisfactory condition.    SPECIMENS REMOVED: Pathology: None.        Gilda CreaseHOMAS P. Shawnell Dykes, MD      TPM / RLD   D:  09/27/2015   14:26   T:  09/27/2015   14:47   Job #:  [id number]

## 2015-09-27 NOTE — Anesthesia Procedure Notes (Signed)
Spinal Block    Start time: 09/27/2015 12:04 PM  End time: 09/27/2015 12:06 PM  Performed by: Atilano InaMCDONOUGH, Mckinsley Koelzer M  Authorized by: Atilano InaMCDONOUGH, Abie Cheek M     Pre-procedure:  Indications: primary anesthetic  Preanesthetic Checklist: risks and benefits discussed, site marked and timeout performed    Timeout Time: 12:04          Spinal Block:   Patient Position:  Seated  Prep Region:  Lumbar  Prep: DuraPrep      Location:  L3-4  Technique:  Single shot    Local Dose (mL):  3    Needle:   Needle Type:  Pencil-tip  Needle Gauge:  25 G  Attempts:  1      Events: CSF confirmed and no blood with aspiration        Assessment:  Insertion:  Uncomplicated  Patient tolerance:  Patient tolerated the procedure well with no immediate complications  1.5 mL 0.75% Spinal Marcaine with dextrose + 0.5 mg Duramorph was deposited into the CSF.

## 2015-09-28 LAB — CBC WITH AUTOMATED DIFF
ABS. BASOPHILS: 0 10*3/uL (ref 0.0–0.1)
ABS. EOSINOPHILS: 0 10*3/uL (ref 0.0–0.4)
ABS. LYMPHOCYTES: 1.6 10*3/uL (ref 0.8–3.5)
ABS. MONOCYTES: 0.1 10*3/uL (ref 0.0–1.0)
ABS. NEUTROPHILS: 11.3 10*3/uL — ABNORMAL HIGH (ref 1.8–8.0)
BASOPHILS: 0 % (ref 0–1)
EOSINOPHILS: 0 % (ref 0–7)
HCT: 26.3 % — ABNORMAL LOW (ref 35.0–47.0)
HGB: 9 g/dL — ABNORMAL LOW (ref 11.5–16.0)
LYMPHOCYTES: 12 % (ref 12–49)
MCH: 31.3 PG (ref 26.0–34.0)
MCHC: 34.2 g/dL (ref 30.0–36.5)
MCV: 91.3 FL (ref 80.0–99.0)
MONOCYTES: 1 % — ABNORMAL LOW (ref 5–13)
NEUTROPHILS: 87 % — ABNORMAL HIGH (ref 32–75)
PLATELET: 164 10*3/uL (ref 150–400)
RBC: 2.88 M/uL — ABNORMAL LOW (ref 3.80–5.20)
RDW: 13.8 % (ref 11.5–14.5)
WBC: 13.1 10*3/uL — ABNORMAL HIGH (ref 3.6–11.0)

## 2015-09-28 MED ORDER — DOCUSATE SODIUM 100 MG CAP
100 mg | Freq: Every day | ORAL | Status: DC
Start: 2015-09-28 — End: 2015-09-30
  Administered 2015-09-29: 01:00:00 via ORAL

## 2015-09-28 MED FILL — OXYCODONE-ACETAMINOPHEN 7.5 MG-325 MG TAB: ORAL | Qty: 1

## 2015-09-28 MED FILL — IBUPROFEN 400 MG TAB: 400 mg | ORAL | Qty: 2

## 2015-09-28 NOTE — Other (Signed)
Bedside shift change report given to C. Rollins, RN (oncoming nurse) by C. Lethco, RN (offgoing nurse). Report included the following information SBAR, Procedure Summary, Intake/Output, MAR and Recent Results.

## 2015-09-28 NOTE — Other (Signed)
Bedside shift change report given to J. Baybutt, RN (oncoming nurse) by C. Rollins, RN (offgoing nurse). Report included the following information SBAR.

## 2015-09-28 NOTE — Progress Notes (Signed)
Post-Operative Day Number 1 Progress Note    Patient doing well post-op day 1 from cesarean delivery without significant complaints.  Pain controlled on current medication.  Voiding without difficulty, normal lochia.    Vitals:  Patient Vitals for the past 8 hrs:   BP Temp Pulse Resp   09/28/15 0745 118/64 98.1 ??F (36.7 ??C) 84 16   09/28/15 0400 116/62 98.1 ??F (36.7 ??C) 78 16   09/28/15 0000 107/61 97.5 ??F (36.4 ??C) 65 16     Temp (24hrs), Avg:98 ??F (36.7 ??C), Min:97.5 ??F (36.4 ??C), Max:98.4 ??F (36.9 ??C)      Vital signs stable, afebrile.    Exam:  Patient without distress.               Abdomen soft, fundus firm at level of umbilicus, nontender.                 Dressing clean, dry and intact               Lower extremities are negative for swelling, cords or tenderness.    Labs:   Recent Results (from the past 24 hour(s))   CBC WITH AUTOMATED DIFF    Collection Time: 09/27/15 10:58 AM   Result Value Ref Range    WBC 10.4 3.6 - 11.0 K/uL    RBC 3.49 (L) 3.80 - 5.20 M/uL    HGB 11.0 (L) 11.5 - 16.0 g/dL    HCT 46.931.7 (L) 62.935.0 - 47.0 %    MCV 90.8 80.0 - 99.0 FL    MCH 31.5 26.0 - 34.0 PG    MCHC 34.7 30.0 - 36.5 g/dL    RDW 52.813.9 41.311.5 - 24.414.5 %    PLATELET 258 150 - 400 K/uL    NEUTROPHILS 79 (H) 32 - 75 %    LYMPHOCYTES 19 12 - 49 %    MONOCYTES 1 (L) 5 - 13 %    EOSINOPHILS 1 0 - 7 %    BASOPHILS 0 0 - 1 %    ABS. NEUTROPHILS 8.2 (H) 1.8 - 8.0 K/UL    ABS. LYMPHOCYTES 2.0 0.8 - 3.5 K/UL    ABS. MONOCYTES 0.1 0.0 - 1.0 K/UL    ABS. EOSINOPHILS 0.1 0.0 - 0.4 K/UL    ABS. BASOPHILS 0.0 0.0 - 0.1 K/UL   TYPE & SCREEN    Collection Time: 09/27/15 11:00 AM   Result Value Ref Range    Crossmatch Expiration 09/30/2015     ABO/Rh(D) O POSITIVE     Antibody screen NEG    CBC WITH AUTOMATED DIFF    Collection Time: 09/28/15  6:10 AM   Result Value Ref Range    WBC 13.1 (H) 3.6 - 11.0 K/uL    RBC 2.88 (L) 3.80 - 5.20 M/uL    HGB 9.0 (L) 11.5 - 16.0 g/dL    HCT 01.026.3 (L) 27.235.0 - 47.0 %     MCV 91.3 80.0 - 99.0 FL    MCH 31.3 26.0 - 34.0 PG    MCHC 34.2 30.0 - 36.5 g/dL    RDW 53.613.8 64.411.5 - 03.414.5 %    PLATELET 164 150 - 400 K/uL    NEUTROPHILS 87 (H) 32 - 75 %    LYMPHOCYTES 12 12 - 49 %    MONOCYTES 1 (L) 5 - 13 %    EOSINOPHILS 0 0 - 7 %    BASOPHILS 0 0 - 1 %    ABS. NEUTROPHILS  11.3 (H) 1.8 - 8.0 K/UL    ABS. LYMPHOCYTES 1.6 0.8 - 3.5 K/UL    ABS. MONOCYTES 0.1 0.0 - 1.0 K/UL    ABS. EOSINOPHILS 0.0 0.0 - 0.4 K/UL    ABS. BASOPHILS 0.0 0.0 - 0.1 K/UL       Assessment and Plan:  Patient appears to be having uncomplicated post-cesarean course.  Continue routine post-op care and maternal education.

## 2015-09-28 NOTE — Progress Notes (Signed)
Intrathecal/Epidural DuraMorphFollow-up Note    1 Day Post-Op sp Procedure(s):  CESAREAN SECTION.    Visit Vitals   ??? BP 118/64 (BP 1 Location: Right arm, BP Patient Position: Sitting)   ??? Pulse 84   ??? Temp 36.7 ??C (98.1 ??F)   ??? Resp 16   ??? Ht  (1.6 m)   ??? Wt 82.1 kg (181 lb)   ??? SpO2 100%   ??? Breastfeeding Unknown   ??? BMI 32.06 kg/m2   .      Pain is well controlled with DuraMorph.  Pain management as per primary service.

## 2015-09-28 NOTE — Progress Notes (Signed)
Patient OOB with assist. Steady gait noted. Linens changed. Bath and peri care done. Patient back to bed. Call bell in reach.

## 2015-09-28 NOTE — Progress Notes (Signed)
Encouraged the patient to get up and the patient stated that she is still to dizzy and lightheaded to get up. Will try again

## 2015-09-28 NOTE — Other (Signed)
Bedside and Verbal shift change report given to C. Lethco RN (oncoming nurse) by N. Schools, RN (offgoing nurse). Report included the following information SBAR, Procedure Summary, Intake/Output and MAR.

## 2015-09-29 MED FILL — OXYCODONE-ACETAMINOPHEN 7.5 MG-325 MG TAB: ORAL | Qty: 1

## 2015-09-29 MED FILL — BUPIVACAINE (PF) 0.75 % (7.5 MG/ML) IJ SOLN: 0.75 % (7.5 mg/mL) | INTRAMUSCULAR | Qty: 1.5

## 2015-09-29 MED FILL — IBUPROFEN 400 MG TAB: 400 mg | ORAL | Qty: 2

## 2015-09-29 MED FILL — DOK 100 MG CAPSULE: 100 mg | ORAL | Qty: 1

## 2015-09-29 MED FILL — OXYTOCIN 20 UNITS/1000 ML IN LACTATED RINGERS IV: 20 unit/1,000 mL | INTRAVENOUS | Qty: 1000

## 2015-09-29 MED FILL — LACTATED RINGERS IV: INTRAVENOUS | Qty: 1000

## 2015-09-29 MED FILL — PHENYLEPHRINE 10 MG/ML INJECTION: 10 mg/mL | INTRAMUSCULAR | Qty: 80

## 2015-09-29 MED FILL — OFIRMEV 1,000 MG/100 ML (10 MG/ML) INTRAVENOUS SOLUTION: 1000 mg/100 mL (10 mg/mL) | INTRAVENOUS | Qty: 100

## 2015-09-29 MED FILL — ONDANSETRON (PF) 4 MG/2 ML INJECTION: 4 mg/2 mL | INTRAMUSCULAR | Qty: 2

## 2015-09-29 NOTE — Progress Notes (Signed)
Post-Operative Day Number 2 Progress Note    Patient doing well post-op day 2 from cesarean delivery without significant complaints.  Pain controlled on current medication.  Voiding without difficulty, normal lochia.    Vitals:  Patient Vitals for the past 8 hrs:   BP Temp Pulse Resp   09/29/15 0400 120/55 98.2 ??F (36.8 ??C) 66 16     Temp (24hrs), Avg:98 ??F (36.7 ??C), Min:97.8 ??F (36.6 ??C), Max:98.2 ??F (36.8 ??C)      Vital signs stable, afebrile.    Exam:  Patient without distress.               Abdomen soft, fundus firm at level of umbilicus, non tender.                Incision dry and clean without erythema.               Lower extremities are negative for swelling, cords or tenderness.    Lab/Data Review:  BMP: No results found for: NA, K, CL, CO2, AGAP, GLU, BUN, CREA, GFRAA, GFRNA  CMP: No results found for: NA, K, CL, CO2, AGAP, GLU, BUN, CREA, GFRAA, GFRNA, CA, MG, PHOS, ALB, TBIL, TP, ALB, GLOB, AGRAT, SGOT, ALT, GPT  CBC: No results found for: WBC, HGB, HGBEXT, HCT, HCTEXT, PLT, PLTEXT, HGBEXT, HCTEXT, PLTEXT    Assessment and Plan:  Patient appears to be having uncomplicated post-cesarean course.  Continue routine post-op care and maternal education.

## 2015-09-29 NOTE — Progress Notes (Signed)
Change of shift report received from S. Maisie Fushomas, RN in [employer]AR format

## 2015-09-29 NOTE — Progress Notes (Signed)
Bedside shift change report given to C. Lethco, RN (oncoming nurse) by J.Teresha Hanks,RN (offgoing nurse). Report included the following information SBAR, Intake/Output, MAR and Recent Results.

## 2015-09-29 NOTE — Other (Signed)
Bedside shift change report given to C. Lethco, RN (oncoming nurse) by J. Baybutt, RN (offgoing nurse). Report included the following information SBAR, Intake/Output, MAR and Recent Results.

## 2015-09-29 NOTE — Progress Notes (Signed)
Bedside and Verbal shift change report given to L, Prillman, RN  (oncoming nurse) by E. Tyrie, RN (offgoing nurse). Report included the following information SBAR, Kardex, OR Summary, Procedure Summary, Intake/Output, MAR and Recent Results.

## 2015-09-29 NOTE — Other (Signed)
Bedside shift change report given to S. Thomas, RN (oncoming nurse) by C. Lethco, RN (offgoing nurse). Report included the following information SBAR, Procedure Summary, Intake/Output, MAR and Recent Results.

## 2015-09-30 MED ORDER — DOCUSATE SODIUM 100 MG CAP
100 mg | ORAL_CAPSULE | Freq: Two times a day (BID) | ORAL | 1 refills | Status: AC
Start: 2015-09-30 — End: 2015-12-29

## 2015-09-30 MED ORDER — OXYCODONE-ACETAMINOPHEN 7.5 MG-325 MG TAB
ORAL_TABLET | ORAL | 0 refills | Status: DC | PRN
Start: 2015-09-30 — End: 2017-08-11

## 2015-09-30 MED ORDER — IBUPROFEN 800 MG TAB
800 mg | ORAL_TABLET | Freq: Three times a day (TID) | ORAL | 1 refills | Status: DC
Start: 2015-09-30 — End: 2018-11-21

## 2015-09-30 MED FILL — IBUPROFEN 400 MG TAB: 400 mg | ORAL | Qty: 2

## 2015-09-30 MED FILL — OXYCODONE-ACETAMINOPHEN 7.5 MG-325 MG TAB: ORAL | Qty: 1

## 2015-09-30 NOTE — Progress Notes (Signed)
Discharge instructions given. Prescriptions given. Questions answered. Verbalized understanding. Discharged to home in stable condition with infant in car seat.

## 2015-09-30 NOTE — Progress Notes (Signed)
Post-Operative Day Number 3 Progress/Discharge Note    Patient doing well post-op day 3 from cesarean delivery without significant complaints.  Pain controlled on current medication.  Voiding without difficulty, normal lochia.    Vitals:  Patient Vitals for the past 8 hrs:   BP Temp Pulse Resp   09/30/15 0708 116/79 98 ??F (36.7 ??C) 61 17     Temp (24hrs), Avg:98.2 ??F (36.8 ??C), Min:98 ??F (36.7 ??C), Max:98.4 ??F (36.9 ??C)      Vital signs stable, afebrile.    Exam:  Patient without distress.               Abdomen soft, fundus firm at level of umbilicus, non tender.  Incision dry and                      clean without erythema.               Lower extremities are negative for swelling, cords or tenderness.    Lab/Data Review:  BMP: No results found for: NA, K, CL, CO2, AGAP, GLU, BUN, CREA, GFRAA, GFRNA  CMP: No results found for: NA, K, CL, CO2, AGAP, GLU, BUN, CREA, GFRAA, GFRNA, CA, MG, PHOS, ALB, TBIL, TP, ALB, GLOB, AGRAT, SGOT, ALT, GPT  CBC: No results found for: WBC, HGB, HGBEXT, HCT, HCTEXT, PLT, PLTEXT, HGBEXT, HCTEXT, PLTEXT    Assessment and Plan:  Patient appears to be having uncomplicated post-cesarean course.  Continue routine post-op care and maternal education.  Plan discharge for today with follow up in our office in 1-2 weeks.  1.  No complaints

## 2015-09-30 NOTE — Progress Notes (Signed)
Patient up to urinate, small plum size clot in toilet. Fundus firm at -1. Bleeding small to scant. Will monitor

## 2015-09-30 NOTE — Discharge Summary (Signed)
Obstetrical Discharge Summary     Name: Whitney Bell MRN: [id number]  SSN: [id number]    Date of Birth: 06/18/1985  Age: 30 y.o.  Sex: female      Admit Date: 09/27/2015    Discharge Date: 09/30/2015     Admitting Physician: Gilda Creasehomas P Mead, MD     Attending Physician:  Gilda Creasehomas P Mead, MD     * Admission Diagnoses: Repeat C/Section  Pregnancy    * Discharge Diagnoses:   Information for the patient's newborn:  [name], GIRL [name] [[id number]][[id number]]   Delivery of a 3 kg female infant via C-Section, Low Transverse on 09/27/2015 at 12:18 PM  by . Apgars were 9 and 9.       Additional Diagnoses:   Hospital Problems as of 09/30/2015  Date Reviewed: 11/11/2012          Codes Class Noted - Resolved POA    Pregnancy ICD-10-CM: Z33.1  ICD-9-CM: V22.2  09/27/2015 - Present Unknown             Lab Results   Component Value Date/Time    ABO/Rh(D) O POSITIVE 09/27/2015 11:00 AM    Rubella, External Immune 5.85 04/24/2015    GrBStrep, External Negative 09/11/2015    ABO,Rh O Positive 04/24/2015      Immunization History   Administered Date(s) Administered   ??? Hepatitis B Vaccine 01/11/2010, 03/20/2010, 12/11/2010   ??? PPD 12/11/2010   ??? TB Skin Test (PPD) Intradermal 11/13/2012   ??? TD Vaccine 11/18/2009       * Procedures: c/s  Procedure(s):  CESAREAN SECTION           * Discharge Condition: good    * Hospital Course: Normal hospital course following the delivery.    * Disposition: Home    Discharge Medications:   Current Discharge Medication List      START taking these medications    Details   docusate sodium (COLACE) 100 mg capsule Take 1 Cap by mouth two (2) times a day for 90 days.  Qty: 30 Cap, Refills: 1      ibuprofen (MOTRIN) 800 mg tablet Take 1 Tab by mouth every eight (8) hours.  Qty: 30 Tab, Refills: 1      oxyCODONE-acetaminophen (PERCOCET 7.5) 7.5-325 mg per tablet Take 1 Tab by mouth every four (4) hours as needed. Max Daily Amount: 6 Tabs.  Qty: 30 Tab, Refills: 0         CONTINUE these medications which have NOT CHANGED    Details    ferrous sulfate (IRON) 325 mg (65 mg iron) tablet Take  by mouth Daily (before breakfast). Indications: IRON DEFICIENCY ANEMIA      prenatal24-iron chel-folic-dha (PRENATAL DHA+COMPLETE PRENATAL) 30-975-300 mg-mcg-mg cmpk Take  by mouth. Indications: Pregnancy      ondansetron (ZOFRAN ODT) 4 mg disintegrating tablet Take 1 Tab by mouth every eight (8) hours as needed for Nausea.  Qty: 10 Tab, Refills: 0             * Follow-up Care/Patient Instructions:  Activity: Activity as tolerated, No driving for 2 weeks, No sex for 6 weeks, No driving while on analgesics and No heavy lifting for 6 weeks  Diet: Regular Diet  Wound Care: Keep wound clean and dry    Follow-up Information     Follow up With Details Comments Contact Info    None   None ([phone]) Patient stated that they have no PCP      [facility] In 1 week or , As needed, If symptoms worsen [phone]           Signed By:  Tobby Fawcett B Mcdaniel Ohms, MD     September 30, 2015

## 2015-09-30 NOTE — Other (Signed)
Bedside and Verbal shift change report given to S. Tracy, RNC (oncoming nurse) by L. Prillaman, RN (offgoing nurse). Report included the following information SBAR.

## 2015-09-30 NOTE — Discharge Summary (Signed)
Obstetrical Discharge Summary     Name: Whitney Bell MRN: [id number]  SSN: [id number]    Date of Birth: 06/18/1985  Age: 30 y.o.  Sex: female      Admit Date: 09/27/2015    Discharge Date: 09/30/2015     Admitting Physician: Gilda Creasehomas P Mead, MD     Attending Physician:  Gilda Creasehomas P Mead, MD     * Admission Diagnoses: Repeat C/Section  Pregnancy    * Discharge Diagnoses:   Information for the patient's newborn:  [name], GIRL [name] [[id number]][[id number]]   Delivery of a 3 kg female infant via C-Section, Low Transverse on 09/27/2015 at 12:18 PM  by . Apgars were 9 and 9.       Additional Diagnoses:   Hospital Problems as of 09/30/2015  Date Reviewed: 11/11/2012          Codes Class Noted - Resolved POA    Pregnancy ICD-10-CM: Z33.1  ICD-9-CM: V22.2  09/27/2015 - Present Unknown             Lab Results   Component Value Date/Time    ABO/Rh(D) O POSITIVE 09/27/2015 11:00 AM    Rubella, External Immune 5.85 04/24/2015    GrBStrep, External Negative 09/11/2015    ABO,Rh O Positive 04/24/2015      Immunization History   Administered Date(s) Administered   ??? Hepatitis B Vaccine 01/11/2010, 03/20/2010, 12/11/2010   ??? PPD 12/11/2010   ??? TB Skin Test (PPD) Intradermal 11/13/2012   ??? TD Vaccine 11/18/2009       * Procedures: c/s  Procedure(s):  CESAREAN SECTION           * Discharge Condition: good    * Hospital Course: Normal hospital course following the delivery.    * Disposition: Home    Discharge Medications:   Current Discharge Medication List      START taking these medications    Details   docusate sodium (COLACE) 100 mg capsule Take 1 Cap by mouth two (2) times a day for 90 days.  Qty: 30 Cap, Refills: 1      ibuprofen (MOTRIN) 800 mg tablet Take 1 Tab by mouth every eight (8) hours.  Qty: 30 Tab, Refills: 1      oxyCODONE-acetaminophen (PERCOCET 7.5) 7.5-325 mg per tablet Take 1 Tab by mouth every four (4) hours as needed. Max Daily Amount: 6 Tabs.  Qty: 30 Tab, Refills: 0         CONTINUE these medications which have NOT CHANGED    Details    ferrous sulfate (IRON) 325 mg (65 mg iron) tablet Take  by mouth Daily (before breakfast). Indications: IRON DEFICIENCY ANEMIA      prenatal24-iron chel-folic-dha (PRENATAL DHA+COMPLETE PRENATAL) 30-975-300 mg-mcg-mg cmpk Take  by mouth. Indications: Pregnancy      ondansetron (ZOFRAN ODT) 4 mg disintegrating tablet Take 1 Tab by mouth every eight (8) hours as needed for Nausea.  Qty: 10 Tab, Refills: 0             * Follow-up Care/Patient Instructions:  Activity: Activity as tolerated, No driving for 2 weeks, No sex for 6 weeks, No driving while on analgesics and No heavy lifting for 6 weeks  Diet: Regular Diet  Wound Care: Keep wound clean and dry    Follow-up Information     Follow up With Details Comments Contact Info    None   None ([phone]) Patient stated that they have no PCP      [facility] In 1 week or , As needed, If symptoms worsen [phone]           Signed By:  Rennis Petty, MD     September 30, 2015

## 2016-01-30 ENCOUNTER — Inpatient Hospital Stay
Admit: 2016-01-30 | Discharge: 2016-01-30 | Disposition: A | Discharge status: Home / Self Care | Payer: Self-pay | Attending: Emergency Medicine

## 2016-01-30 DIAGNOSIS — A5901 Trichomonal vulvovaginitis: Secondary | ICD-10-CM

## 2016-01-30 LAB — URINALYSIS W/ REFLEX CULTURE
Bacteria: NEGATIVE /hpf
Bilirubin: NEGATIVE
Blood: NEGATIVE
Glucose: NEGATIVE mg/dL
Ketone: NEGATIVE mg/dL
Nitrites: NEGATIVE
Specific gravity: 1.025 (ref 1.003–1.030)
Urobilinogen: 1 EU/dL (ref 0.2–1.0)
pH (UA): 8 (ref 5.0–8.0)

## 2016-01-30 LAB — KOH, OTHER SOURCES: KOH: NONE SEEN

## 2016-01-30 LAB — HCG URINE, QL. - POC: Pregnancy test,urine (POC): NEGATIVE

## 2016-01-30 LAB — WET PREP: Clue cells: ABSENT

## 2016-01-30 MED ORDER — METRONIDAZOLE 500 MG TAB
500 mg | ORAL_TABLET | Freq: Two times a day (BID) | ORAL | 0 refills | Status: DC
Start: 2016-01-30 — End: 2017-08-11

## 2016-01-30 MED ORDER — CEFTRIAXONE 250 MG SOLUTION FOR INJECTION
250 mg | INTRAMUSCULAR | Status: AC
Start: 2016-01-30 — End: 2016-01-30
  Administered 2016-01-30: 18:00:00 via INTRAMUSCULAR

## 2016-01-30 MED ORDER — AZITHROMYCIN 250 MG TAB
250 mg | ORAL | Status: AC
Start: 2016-01-30 — End: 2016-01-30
  Administered 2016-01-30: 18:00:00 via ORAL

## 2016-01-30 MED ORDER — NAPROXEN 500 MG TAB
500 mg | ORAL_TABLET | Freq: Two times a day (BID) | ORAL | 0 refills | Status: AC
Start: 2016-01-30 — End: 2016-02-09

## 2016-01-30 MED ORDER — METHOCARBAMOL 500 MG TAB
500 mg | ORAL_TABLET | Freq: Four times a day (QID) | ORAL | 0 refills | Status: DC | PRN
Start: 2016-01-30 — End: 2018-11-21

## 2016-01-30 MED FILL — AZITHROMYCIN 250 MG TAB: 250 mg | ORAL | Qty: 4

## 2016-01-30 MED FILL — CEFTRIAXONE 250 MG SOLUTION FOR INJECTION: 250 mg | INTRAMUSCULAR | Qty: 250
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
## 2016-01-30 NOTE — ED Triage Notes (Signed)
C/o lower back pain with vaginal discharge x 2-3 weeks  Also reported that her last period was lighter and shorter, recently delivered baby 4 months ago

## 2016-01-30 NOTE — ED Notes (Signed)
Emergency Department Nursing Plan of Care       The Nursing Plan of Care is developed from the Nursing assessment and Emergency Department Attending provider initial evaluation.  The plan of care may be reviewed in the ???ED Provider note???.    The Plan of Care was developed with the following considerations:   Patient / Family readiness to learn indicated [id number]:verbalized understanding  Persons(s) to be included in education: patient  Barriers to Learning/Limitations:No    Signed     Stanford ScotlandMamta A Christopher Glasscock, RN    01/30/2016   12:02 PM

## 2016-01-30 NOTE — ED Notes (Signed)
D/c by provider so unable to assess d/c pain level.

## 2016-01-31 LAB — CULTURE, URINE
Colonies Counted: >100000
Colony Count: >100000

## 2016-01-31 LAB — CHLAMYDIA/GC PCR
Chlamydia amplified: NEGATIVE
N. gonorrhea, amplified: NEGATIVE

## 2016-08-22 ENCOUNTER — Inpatient Hospital Stay: Admit: 2016-08-22 | Discharge: 2016-08-22 | Payer: PRIVATE HEALTH INSURANCE | Attending: Emergency Medicine

## 2016-08-22 DIAGNOSIS — H5712 Ocular pain, left eye: Secondary | ICD-10-CM

## 2016-08-22 MED ORDER — FLUORESCEIN 1 MG EYE STRIPS
1 mg | OPHTHALMIC | Status: AC
Start: 2016-08-22 — End: 2016-08-22
  Administered 2016-08-22: 20:00:00 via OPHTHALMIC

## 2016-08-22 MED ORDER — TETRACAINE HCL (PF) 0.5 % EYE DROPS
0.5 % | OPHTHALMIC | Status: AC
Start: 2016-08-22 — End: 2016-08-22
  Administered 2016-08-22: 20:00:00 via OPHTHALMIC

## 2016-08-22 MED FILL — FUL-GLO 1 MG EYE STRIPS: 1 mg | OPHTHALMIC | Qty: 1

## 2016-08-22 MED FILL — TETRACAINE HCL (PF) 0.5 % EYE DROPS: 0.5 % | OPHTHALMIC | Qty: 4
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
## 2016-08-22 NOTE — ED Notes (Cosign Needed Addendum)
3:26 PM  I have evaluated the patient as the Provider in Triage. I have reviewed Her vital signs and the triage nurse assessment. I have talked with the patient and any available family and advised that I am the provider in triage and have ordered the appropriate study to initiate their work up based on the clinical presentation during my assessment.  I have advised that the patient will be accommodated in the Main ED as soon as possible.  I have also requested to contact the triage nurse or myself immediately if the patient experiences any changes in their condition during this brief waiting period.    Patient reports onset of left eye irritation 2 nights ago after her 7510 month old accidentally hit her in the eye, followed by redness and drainage yesterday. The eye pain is better today, but now with headache and photophobia.  Omar PersonHannah J Jayesh Marbach, PA

## 2016-08-22 NOTE — ED Notes (Signed)
Pt eloped, unable to locate in or outside of ED.

## 2016-08-22 NOTE — ED Triage Notes (Signed)
TRIAGE NOTE: Patient arrived from home with c/o LEFT eye pain and redness for the past 2 days. Denies itching. "it has been crusty but the pain is getting better."

## 2017-08-11 ENCOUNTER — Inpatient Hospital Stay
Admit: 2017-08-11 | Discharge: 2017-08-11 | Disposition: A | Discharge status: Home / Self Care | Payer: MEDICAID | Attending: Emergency Medicine

## 2017-08-11 DIAGNOSIS — H1032 Unspecified acute conjunctivitis, left eye: Secondary | ICD-10-CM

## 2017-08-11 MED ORDER — TRIMETHOPRIM-POLYMYXIN B 0.1 %-10,000 UNIT/ML EYE DROPS
10000 unit- 1 mg/mL | OPHTHALMIC | 0 refills | Status: AC
Start: 2017-08-11 — End: 2017-08-18

## 2017-08-11 NOTE — ED Notes (Signed)
Patient discharged by provider. Patient given copy of dc instructions and one script(s).  Patient verbalized understanding of instructions and script(s).  Patient given a current medication reconciliation form and verbalized understanding of their medications.   Patient verbalized understanding of the importance of discussing medications with  his or her physician or clinic when they follow up.  Patient alert and oriented and in no acute distress.  Pt verbalizes pain scale of 0 out of 10.  Patient discharged home without assistance. Wheelchair was declined.

## 2017-08-11 NOTE — ED Notes (Signed)
Emergency Department Nursing Plan of Care       The Nursing Plan of Care is developed from the Nursing assessment and Emergency Department Attending provider initial evaluation.  The plan of care may be reviewed in the ???ED Provider note???.    The Plan of Care was developed with the following considerations:   Patient / Family readiness to learn indicated by:verbalized understanding  Persons(s) to be included in education: patient  Barriers to Learning/Limitations:No    Signed     Chakita M Copeland, RN    08/11/2017   1:31 PM

## 2017-08-11 NOTE — ED Notes (Signed)
Pt in with c/o left eye redness and tearing. Questions pink eye. Voices no other needs or concerns.

## 2017-08-11 NOTE — ED Triage Notes (Signed)
Right eye drainage/sensitivity to light since yesterday
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
## 2017-08-11 NOTE — ED Notes (Signed)
Right eye drainage/sensitivity to light since yesterday

## 2017-08-11 NOTE — ED Notes (Signed)
 Emergency Department Nursing Plan of Care       The Nursing Plan of Care is developed from the Nursing assessment and Emergency Department Attending provider initial evaluation.  The plan of care may be reviewed in the "ED Provider note".    The Plan of Care was developed with the following considerations:   Patient / Family readiness to learn indicated ab:czmajopszi understanding  Persons(s) to be included in education: patient  Barriers to Learning/Limitations:No    Signed     Odette CHRISTELLA Perfect, RN    08/11/2017   1:31 PM

## 2017-08-11 NOTE — ED Notes (Signed)
Pt in with c/o left eye redness and tearing. Questions pink eye. Voices no other needs or concerns.

## 2017-12-29 ENCOUNTER — Inpatient Hospital Stay
Admit: 2017-12-29 | Discharge: 2017-12-29 | Disposition: A | Discharge status: Home / Self Care | Payer: MEDICAID | Attending: Emergency Medicine

## 2017-12-29 DIAGNOSIS — B349 Viral infection, unspecified: Secondary | ICD-10-CM

## 2017-12-29 LAB — LIPASE
Lipase: 68 U/L — ABNORMAL LOW (ref 73–393)
Lipase: 68 U/L — ABNORMAL LOW (ref 73–393)

## 2017-12-29 LAB — URINALYSIS W/ REFLEX CULTURE
BACTERIA, URINE: NEGATIVE /hpf
Bacteria: NEGATIVE /hpf
Bilirubin, Urine: NEGATIVE
Bilirubin: NEGATIVE
Blood, Urine: NEGATIVE
Blood: NEGATIVE
Glucose, Ur: NEGATIVE mg/dL
Glucose: NEGATIVE mg/dL
Ketone: NEGATIVE mg/dL
Ketones, Urine: NEGATIVE mg/dL
Leukocyte Esterase, Urine: NEGATIVE
Leukocyte Esterase: NEGATIVE
Nitrite, Urine: NEGATIVE
Nitrites: NEGATIVE
Protein, UA: NEGATIVE mg/dL
Protein: NEGATIVE mg/dL
Specific Gravity, UA: 1.019 (ref 1.003–1.030)
Specific gravity: 1.019 (ref 1.003–1.030)
Urobilinogen, UA, POCT: 1 EU/dL (ref 0.2–1.0)
Urobilinogen: 1 EU/dL (ref 0.2–1.0)
pH (UA): 6 (ref 5.0–8.0)
pH, UA: 6 (ref 5.0–8.0)

## 2017-12-29 LAB — COMPREHENSIVE METABOLIC PANEL
ALT: 9 U/L — ABNORMAL LOW (ref 12–78)
AST: 11 U/L — ABNORMAL LOW (ref 15–37)
Albumin/Globulin Ratio: 0.9 — ABNORMAL LOW (ref 1.1–2.2)
Albumin: 3.9 g/dL (ref 3.5–5.0)
Alkaline Phosphatase: 62 U/L (ref 45–117)
Anion Gap: 4 mmol/L — ABNORMAL LOW (ref 5–15)
BUN: 14 MG/DL (ref 6–20)
Bun/Cre Ratio: 18 (ref 12–20)
CO2: 25 mmol/L (ref 21–32)
Calcium: 9 MG/DL (ref 8.5–10.1)
Chloride: 106 mmol/L (ref 97–108)
Creatinine: 0.77 MG/DL (ref 0.55–1.02)
EGFR IF NonAfrican American: >60 mL/min/{1.73_m2} (ref >60)
GFR African American: >60 mL/min/{1.73_m2} (ref >60)
Globulin: 4.5 g/dL — ABNORMAL HIGH (ref 2.0–4.0)
Glucose: 81 mg/dL (ref 65–100)
Potassium: 4 mmol/L (ref 3.5–5.1)
Sodium: 135 mmol/L — ABNORMAL LOW (ref 136–145)
Total Bilirubin: 0.7 MG/DL (ref 0.2–1.0)
Total Protein: 8.4 g/dL — ABNORMAL HIGH (ref 6.4–8.2)

## 2017-12-29 LAB — CBC WITH AUTO DIFFERENTIAL
Basophils %: 0 % (ref 0–1)
Basophils Absolute: 0 10*3/uL (ref 0.0–0.1)
Eosinophils %: 1 % (ref 0–7)
Eosinophils Absolute: 0.1 10*3/uL (ref 0.0–0.4)
Granulocyte Absolute Count: 0 10*3/uL (ref 0.00–0.04)
Hematocrit: 39.6 % (ref 35.0–47.0)
Hemoglobin: 13 g/dL (ref 11.5–16.0)
Immature Granulocytes: 0 % (ref 0.0–0.5)
Lymphocytes %: 22 % (ref 12–49)
Lymphocytes Absolute: 1.6 10*3/uL (ref 0.8–3.5)
MCH: 32.1 PG (ref 26.0–34.0)
MCHC: 32.8 g/dL (ref 30.0–36.5)
MCV: 97.8 FL (ref 80.0–99.0)
MPV: 10.9 FL (ref 8.9–12.9)
Monocytes %: 4 % — ABNORMAL LOW (ref 5–13)
Monocytes Absolute: 0.3 10*3/uL (ref 0.0–1.0)
NRBC Absolute: 0 10*3/uL (ref 0.00–0.01)
Neutrophils %: 73 % (ref 32–75)
Neutrophils Absolute: 5.4 10*3/uL (ref 1.8–8.0)
Nucleated RBCs: 0 PER 100 WBC
Platelets: 215 10*3/uL (ref 150–400)
RBC: 4.05 M/uL (ref 3.80–5.20)
RDW: 13 % (ref 11.5–14.5)
WBC: 7.4 10*3/uL (ref 3.6–11.0)

## 2017-12-29 LAB — EKG 12-LEAD
Atrial Rate: 73 {beats}/min
Diagnosis: NORMAL
P Axis: 59 degrees
P-R Interval: 154 ms
Q-T Interval: 404 ms
QRS Duration: 90 ms
QTc Calculation (Bazett): 445 ms
R Axis: 67 degrees
T Axis: 42 degrees
Ventricular Rate: 73 {beats}/min

## 2017-12-29 LAB — HCG URINE, QL. - POC
HCG, Pregnancy, Urine, POC: NEGATIVE
Pregnancy test,urine (POC): NEGATIVE

## 2017-12-29 LAB — D-DIMER, QUANTITATIVE: D-Dimer, Quant: 0.34 mg/L FEU (ref 0.00–0.65)

## 2017-12-29 LAB — EKG, 12 LEAD, INITIAL
Atrial Rate: 73 {beats}/min
Calculated P Axis: 59 degrees
Calculated R Axis: 67 degrees
Calculated T Axis: 42 degrees
Diagnosis: NORMAL
P-R Interval: 154 ms
Q-T Interval: 404 ms
QRS Duration: 90 ms
QTC Calculation (Bezet): 445 ms
Ventricular Rate: 73 {beats}/min

## 2017-12-29 LAB — CBC WITH AUTOMATED DIFF
ABS. BASOPHILS: 0 10*3/uL (ref 0.0–0.1)
ABS. EOSINOPHILS: 0.1 10*3/uL (ref 0.0–0.4)
ABS. IMM. GRANS.: 0 10*3/uL (ref 0.00–0.04)
ABS. LYMPHOCYTES: 1.6 10*3/uL (ref 0.8–3.5)
ABS. MONOCYTES: 0.3 10*3/uL (ref 0.0–1.0)
ABS. NEUTROPHILS: 5.4 10*3/uL (ref 1.8–8.0)
ABSOLUTE NRBC: 0 10*3/uL (ref 0.00–0.01)
BASOPHILS: 0 % (ref 0–1)
EOSINOPHILS: 1 % (ref 0–7)
HCT: 39.6 % (ref 35.0–47.0)
HGB: 13 g/dL (ref 11.5–16.0)
IMMATURE GRANULOCYTES: 0 % (ref 0.0–0.5)
LYMPHOCYTES: 22 % (ref 12–49)
MCH: 32.1 PG (ref 26.0–34.0)
MCHC: 32.8 g/dL (ref 30.0–36.5)
MCV: 97.8 FL (ref 80.0–99.0)
MONOCYTES: 4 % — ABNORMAL LOW (ref 5–13)
MPV: 10.9 FL (ref 8.9–12.9)
NEUTROPHILS: 73 % (ref 32–75)
NRBC: 0 PER 100 WBC
PLATELET: 215 10*3/uL (ref 150–400)
RBC: 4.05 M/uL (ref 3.80–5.20)
RDW: 13 % (ref 11.5–14.5)
WBC: 7.4 10*3/uL (ref 3.6–11.0)

## 2017-12-29 LAB — METABOLIC PANEL, COMPREHENSIVE
A-G Ratio: 0.9 — ABNORMAL LOW (ref 1.1–2.2)
ALT (SGPT): 9 U/L — ABNORMAL LOW (ref 12–78)
AST (SGOT): 11 U/L — ABNORMAL LOW (ref 15–37)
Albumin: 3.9 g/dL (ref 3.5–5.0)
Alk. phosphatase: 62 U/L (ref 45–117)
Anion gap: 4 mmol/L — ABNORMAL LOW (ref 5–15)
BUN/Creatinine ratio: 18 (ref 12–20)
BUN: 14 MG/DL (ref 6–20)
Bilirubin, total: 0.7 MG/DL (ref 0.2–1.0)
CO2: 25 mmol/L (ref 21–32)
Calcium: 9 MG/DL (ref 8.5–10.1)
Chloride: 106 mmol/L (ref 97–108)
Creatinine: 0.77 MG/DL (ref 0.55–1.02)
GFR est AA: >60 mL/min/{1.73_m2} (ref >60)
GFR est non-AA: >60 mL/min/{1.73_m2} (ref >60)
Globulin: 4.5 g/dL — ABNORMAL HIGH (ref 2.0–4.0)
Glucose: 81 mg/dL (ref 65–100)
Potassium: 4 mmol/L (ref 3.5–5.1)
Protein, total: 8.4 g/dL — ABNORMAL HIGH (ref 6.4–8.2)
Sodium: 135 mmol/L — ABNORMAL LOW (ref 136–145)

## 2017-12-29 LAB — D DIMER: D-dimer: 0.34 mg/L FEU (ref 0.00–0.65)

## 2017-12-29 MED ORDER — DIPHENHYDRAMINE HCL 50 MG/ML IJ SOLN
50 mg/mL | INTRAMUSCULAR | Status: AC
Start: 2017-12-29 — End: 2017-12-29
  Administered 2017-12-29: 20:00:00 via INTRAVENOUS

## 2017-12-29 MED ORDER — METOCLOPRAMIDE 5 MG/ML IJ SOLN
5 mg/mL | INTRAMUSCULAR | Status: AC
Start: 2017-12-29 — End: 2017-12-29
  Administered 2017-12-29: 20:00:00 via INTRAVENOUS

## 2017-12-29 MED ORDER — ONDANSETRON (PF) 4 MG/2 ML INJECTION
4 mg/2 mL | INTRAMUSCULAR | Status: AC
Start: 2017-12-29 — End: 2017-12-29
  Administered 2017-12-29: 20:00:00 via INTRAVENOUS

## 2017-12-29 MED ORDER — SODIUM CHLORIDE 0.9% BOLUS IV
0.9 % | Freq: Once | INTRAVENOUS | Status: AC
Start: 2017-12-29 — End: 2017-12-29
  Administered 2017-12-29: 20:00:00 via INTRAVENOUS

## 2017-12-29 MED ORDER — SODIUM CHLORIDE 0.9% BOLUS IV
0.9 % | Freq: Once | INTRAVENOUS | Status: DC
Start: 2017-12-29 — End: 2017-12-29

## 2017-12-29 MED FILL — METOCLOPRAMIDE 5 MG/ML IJ SOLN: 5 mg/mL | INTRAMUSCULAR | Qty: 2

## 2017-12-29 MED FILL — DIPHENHYDRAMINE HCL 50 MG/ML IJ SOLN: 50 mg/mL | INTRAMUSCULAR | Qty: 1

## 2017-12-29 MED FILL — SODIUM CHLORIDE 0.9 % IV: INTRAVENOUS | Qty: 1000

## 2017-12-29 MED FILL — ONDANSETRON (PF) 4 MG/2 ML INJECTION: 4 mg/2 mL | INTRAMUSCULAR | Qty: 2

## 2017-12-29 NOTE — ED Notes (Signed)
Pt refusing hall bed at this time.
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
## 2017-12-29 NOTE — ED Notes (Signed)
Pt refusing hall bed at this time.

## 2018-11-21 ENCOUNTER — Emergency Department: Admit: 2018-11-21 | Payer: MEDICAID | Primary: Student in an Organized Health Care Education/Training Program

## 2018-11-21 ENCOUNTER — Inpatient Hospital Stay
Admit: 2018-11-21 | Discharge: 2018-11-21 | Disposition: A | Discharge status: Home / Self Care | Payer: MEDICAID | Attending: Emergency Medicine

## 2018-11-21 DIAGNOSIS — S39012A Strain of muscle, fascia and tendon of lower back, initial encounter: Secondary | ICD-10-CM

## 2018-11-21 MED ORDER — CYCLOBENZAPRINE 10 MG TAB
10 mg | ORAL_TABLET | Freq: Three times a day (TID) | ORAL | 0 refills | Status: AC | PRN
Start: 2018-11-21 — End: 2021-01-19

## 2018-11-21 MED ORDER — BUTALBITAL-ACETAMINOPHEN-CAFFEINE 50 MG-300 MG-40 MG CAPSULE
50-300-40 mg | ORAL_CAPSULE | Freq: Four times a day (QID) | ORAL | 0 refills | Status: AC | PRN
Start: 2018-11-21 — End: 2021-01-19

## 2018-11-21 MED ORDER — NAPROXEN 250 MG TAB
250 mg | ORAL | Status: AC
Start: 2018-11-21 — End: 2018-11-21
  Administered 2018-11-21: 21:00:00 via ORAL

## 2018-11-21 MED ORDER — NAPROXEN 500 MG TAB
500 mg | ORAL_TABLET | Freq: Two times a day (BID) | ORAL | 0 refills | Status: DC | PRN
Start: 2018-11-21 — End: 2019-02-25

## 2018-11-21 MED ORDER — BUTALBITAL-ACETAMINOPHEN-CAFFEINE 50 MG-325 MG-40 MG TAB
50-325-40 mg | ORAL | Status: AC
Start: 2018-11-21 — End: 2018-11-21
  Administered 2018-11-21: 21:00:00 via ORAL

## 2018-11-21 MED ORDER — CYCLOBENZAPRINE 10 MG TAB
10 mg | ORAL | Status: AC
Start: 2018-11-21 — End: 2018-11-21
  Administered 2018-11-21: 21:00:00 via ORAL

## 2018-11-21 MED FILL — NAPROXEN 250 MG TAB: 250 mg | ORAL | Qty: 2

## 2018-11-21 MED FILL — BUTALBITAL-ACETAMINOPHEN-CAFFEINE 50 MG-325 MG-40 MG TAB: 50-325-40 mg | ORAL | Qty: 1

## 2018-11-21 MED FILL — CYCLOBENZAPRINE 10 MG TAB: 10 mg | ORAL | Qty: 1
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
## 2018-11-21 NOTE — ED Notes (Signed)
Discharge instructions reviewed with patient, copy given by this RN. Pt is accomponied by family, denies use of wheelchair.
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
## 2019-02-24 NOTE — Other (Signed)
Unable to reach patient after many attempts. She is no longer @ the listed work #. Her emergency contact # mailbox is not set up. Attempted to reach Dr.Coury's office for additional contact # & could not get through.

## 2019-02-24 NOTE — Interval H&P Note (Signed)
Unable to reach patient after many attempts. She is no longer @ the listed work #. Her emergency contact # mailbox is not set up. Attempted to reach Dr.Coury's office for additional contact # & could not get through.

## 2019-02-25 NOTE — Other (Signed)
[facility]  Preoperative Instructions        Surgery Date 03/02/19          Time of Arrival 1100    1. On the day of your surgery, please report to the Surgical Services Registration Desk and sign in at your designated time. The Surgery Center is located to the right of the Emergency Room.     2. You must have someone with you to drive you home. You should not drive a car for 24 hours following surgery. Please make arrangements for a friend or family member to stay with you for the first 24 hours after your surgery.    3. Do not have anything to eat or drink (including water, gum, mints, coffee, juice) after midnight 03/01/19??      Marland Kitchen?This may not apply to medications prescribed by your physician. ?(Please note below the special instructions with medications to take the morning of your procedure.)    4. We recommend you do not drink any alcoholic beverages for 24 hours before and after your surgery.    5. Contact your surgeon???s office for instructions on the following medications: non-steroidal anti-inflammatory drugs (i.e. Advil, Aleve), vitamins, and supplements. (Some surgeon???s will want you to stop these medications prior to surgery and others may allow you to take them)  **If you are currently taking Plavix, Coumadin, Aspirin and/or other blood-thinning agents, contact your surgeon for instructions.** Your surgeon will partner with the physician prescribing these medications to determine if it is safe to stop or if you need to continue taking.  Please do not stop taking these medications without instructions from your surgeon     6. Wear comfortable clothes.  Wear glasses instead of contacts.  Do not bring any money or jewelry. Please bring picture ID, insurance card, and any prearranged co-payment or hospital payment.  Do not wear make-up, particularly mascara the morning of your surgery.  Do not wear nail polish, particularly if you are having foot /hand surgery.  Wear your hair loose or down, no ponytails, buns, bobby pins or clips.  All body piercings must be removed.  Please shower with antibacterial soap for three consecutive days before and on the morning of surgery, but do not apply any lotions, powders or deodorants after the shower on the day of surgery. Please use a fresh towels after each shower. Please sleep in clean clothes and change bed linens the night before surgery.  Please do not shave for 48 hours prior to surgery. Shaving of the face is acceptable.    7. You should understand that if you do not follow these instructions your surgery may be cancelled.  If your physical condition changes (I.e. fever, cold or flu) please contact your surgeon as soon as possible.    8. It is important that you be on time.  If a situation occurs where you may be late, please call [phone] (OR Holding Area).    9. If you have any questions and or problems, please call [phone] (Pre-admission Testing).    10. Your surgery time may be subject to change.  You will receive a phone call the evening prior if your time changes.    11.  If having outpatient surgery, you must have someone to drive you here, stay with you during the duration of your stay, and to drive you home at time of discharge.    Special Instructions: none    TAKE ALL MEDICATIONS DAY OF SURGERY  EXCEPT:nsaids    I understand a pre-operative phone call will be made to verify my surgery time.  In the event that I am not available, I give permission for a message to be left on my answering service and/or with another person?  {yes @ [phone]          ___________________      __________   _________    (Signature of Patient)             (Witness)                (Date and Time)

## 2019-02-25 NOTE — Interval H&P Note (Signed)
[facility]  Notes by Melina Copa, RN at 02/25/19 [phone]                Author: Melina Copa, RN  Service: NURSING  Author Type: Registered Nurse       Filed: 02/25/19 0842  Date of Service: 02/25/19 0841  Status: Signed          Editor: Melina Copa, RN (Registered Nurse)                          [facility]   Preoperative Instructions            Surgery Date 03/02/19          Time of Arrival 1100      1. On the day of your surgery, please report to the Surgical Services Registration Desk and sign in at your designated time. The Surgery Center is located to the right of the Emergency Room.       2. You must have someone with you to drive you home. You should not drive a car for 24 hours following surgery. Please make arrangements for a friend or family member to stay with you for the first 24 hours after your surgery.      3. Do not have anything to eat or drink (including water, gum, mints, coffee, juice) after midnight 03/01/19??      Marland Kitchen?This may  not apply to medications prescribed by your physician. ?(Please note below the special instructions with medications to take  the morning of your procedure.)      4. We recommend you do not drink any alcoholic beverages for 24 hours before and after your surgery.      5. Contact your surgeons office for instructions on the following medications: non-steroidal anti-inflammatory drugs (i.e. Advil, Aleve), vitamins, and supplements. (Some surgeons will want you to stop these medications prior to surgery  and others may allow you to take them)   **If you are currently taking Plavix, Coumadin, Aspirin and/or other blood-thinning agents, contact your surgeon for instructions.** Your surgeon will partner with the physician prescribing  these medications to determine if it is safe to stop or if you need to continue taking.  Please do not stop taking these medications without instructions from your surgeon      6. Wear comfortable clothes.  Wear  glasses instead of contacts.  Do not bring any money or jewelry. Please bring picture ID, insurance card, and any prearranged co-payment or hospital payment.  Do not wear make-up, particularly mascara the morning of  your surgery.  Do not wear nail polish, particularly if you are having foot /hand surgery.  Wear your hair loose or down, no ponytails, buns, bobby pins or clips.  All body piercings must be removed.   Please shower with antibacterial soap for three consecutive days before and on the morning of surgery, but do not apply any lotions, powders or deodorants after the shower on the day of surgery.  Please use a fresh towels after each shower. Please sleep in clean clothes and change bed linens the night before surgery.  Please do not shave for 48 hours prior to surgery. Shaving of the face is acceptable.      7. You should understand that if you do not follow these instructions your surgery may be cancelled.  If your physical condition changes (I.e. fever, cold or flu) please contact your surgeon as  soon as possible.      8. It is important that you be on time.  If a situation occurs where you may be late, please call [phone] (OR Holding Area).      9. If you have any questions and or problems, please call [phone] (Pre-admission Testing).      10. Your surgery time may be subject to change.  You will receive a phone call the evening prior if your time changes.      11.  If having outpatient surgery, you must have someone to drive you here, stay with you during the duration of your stay, and to drive you home at time of discharge.      Special Instructions: none      TAKE ALL MEDICATIONS DAY OF SURGERY EXCEPT:nsaids      I understand a pre-operative phone call will be made to verify my surgery time.  In the event that I am not available, I give permission for a message to be left on my answering service and/or with another person?  {yes @ [phone]             ___________________       __________   _________     (Signature of Patient)             (Witness)                (Date and Time)

## 2019-02-26 ENCOUNTER — Inpatient Hospital Stay: Admit: 2019-02-25 | Payer: MEDICAID | Primary: Student in an Organized Health Care Education/Training Program

## 2019-02-26 NOTE — Other (Signed)
Patient did not show up for covid screening. Patient unable to be reached x2. Talked with Amy in main OR PAT and made aware of patient's situation.

## 2019-02-26 NOTE — Interval H&P Note (Signed)
Patient did not show up for covid screening. Patient unable to be reached x2. Talked with Amy in main OR PAT and made aware of patient's situation.

## 2019-03-13 ENCOUNTER — Inpatient Hospital Stay
Admit: 2019-03-13 | Discharge: 2019-03-13 | Disposition: A | Discharge status: Home / Self Care | Payer: MEDICAID | Attending: Emergency Medicine

## 2019-03-13 ENCOUNTER — Emergency Department: Admit: 2019-03-13 | Payer: MEDICAID | Primary: Student in an Organized Health Care Education/Training Program

## 2019-03-13 DIAGNOSIS — R0789 Other chest pain: Secondary | ICD-10-CM

## 2019-03-13 LAB — EKG 12-LEAD
Atrial Rate: 85 {beats}/min
Diagnosis: NORMAL
P Axis: 64 degrees
P-R Interval: 164 ms
Q-T Interval: 384 ms
QRS Duration: 88 ms
QTc Calculation (Bazett): 456 ms
R Axis: 64 degrees
T Axis: 52 degrees
Ventricular Rate: 85 {beats}/min

## 2019-03-13 LAB — EKG, 12 LEAD, INITIAL
Atrial Rate: 85 {beats}/min
Calculated P Axis: 64 degrees
Calculated R Axis: 64 degrees
Calculated T Axis: 52 degrees
Diagnosis: NORMAL
P-R Interval: 164 ms
Q-T Interval: 384 ms
QRS Duration: 88 ms
QTC Calculation (Bezet): 456 ms
Ventricular Rate: 85 {beats}/min

## 2019-03-13 MED ORDER — DIAZEPAM 5 MG TAB
5 mg | ORAL | Status: AC
Start: 2019-03-13 — End: 2019-03-13
  Administered 2019-03-13: 13:00:00 via ORAL

## 2019-03-13 MED ORDER — HYDROXYZINE 25 MG TAB
25 mg | ORAL_TABLET | Freq: Four times a day (QID) | ORAL | 0 refills | Status: AC | PRN
Start: 2019-03-13 — End: 2019-03-23

## 2019-03-13 MED FILL — DIAZEPAM 5 MG TAB: 5 mg | ORAL | Qty: 1

## 2019-03-13 NOTE — ED Notes (Signed)
Verbal and written discharge instructions given by provider.
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
## 2019-03-13 NOTE — ED Notes (Signed)
Verbal and written discharge instructions given by provider.

## 2019-09-01 ENCOUNTER — Inpatient Hospital Stay
Admit: 2019-09-01 | Discharge: 2019-09-02 | Disposition: A | Discharge status: Home / Self Care | Payer: MEDICAID | Attending: Emergency Medicine

## 2019-09-01 DIAGNOSIS — R200 Anesthesia of skin: Secondary | ICD-10-CM

## 2019-09-01 NOTE — ED Notes (Signed)
Pt discharged by MD. Pt provided with discharge instructions Rx and instructions on follow up care. Pt out of ED ambulatory without difficulty accompanied by family.
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
## 2019-09-01 NOTE — ED Notes (Signed)
Dr.Bernstein at bedside evaluating pt

## 2019-09-02 MED ORDER — GABAPENTIN 300 MG CAP
300 mg | ORAL_CAPSULE | Freq: Two times a day (BID) | ORAL | 0 refills | Status: AC
Start: 2019-09-02 — End: 2021-01-19

## 2020-10-16 ENCOUNTER — Ambulatory Visit: Payer: MEDICAID | Attending: Family Medicine | Primary: Family Medicine

## 2020-12-18 ENCOUNTER — Encounter: Payer: MEDICAID | Attending: Family Medicine | Primary: Family Medicine

## 2021-01-19 ENCOUNTER — Ambulatory Visit: Admit: 2021-01-19 | Discharge: 2021-01-19 | Payer: MEDICAID | Attending: Family Medicine | Primary: Family Medicine

## 2021-01-19 DIAGNOSIS — I10 Essential (primary) hypertension: Secondary | ICD-10-CM

## 2021-01-19 MED ORDER — ALBUTEROL SULFATE HFA 90 MCG/ACTUATION AEROSOL INHALER
90 mcg/actuation | Freq: Four times a day (QID) | RESPIRATORY_TRACT | 3 refills | Status: AC | PRN
Start: 2021-01-19 — End: ?
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
## 2021-01-19 NOTE — Progress Notes (Signed)
hld

## 2021-01-19 NOTE — Progress Notes (Signed)
Chief Complaint   Patient presents with    Annual Wellness Visit     New to provider     1. "Have you been to the ER, urgent care clinic since your last visit?  Hospitalized since your last visit?" No    2. "Have you seen or consulted any other health care providers outside of the [facility] since your last visit?" No     3. For patients aged 33-75: Has the patient had a colonoscopy / FIT/ Cologuard? NA - based on age      If the patient is female:    4. For patients aged 21-74: Has the patient had a mammogram within the past 2 years? NA - based on age or sex      7. For patients aged 21-65: Has the patient had a pap smear? Yes - Care Gap present. Rooming MA/LPN to request most recent results [facility] 02/2020

## 2021-01-23 LAB — LIPID PANEL
CHOL/HDL Ratio: 2.6 (ref 0.0–5.0)
Chol/HDL Ratio: 2.6 (ref 0.0–5.0)
Cholesterol, Total: 206 MG/DL — ABNORMAL HIGH (ref <200)
Cholesterol, total: 206 MG/DL — ABNORMAL HIGH (ref <200)
HDL Cholesterol: 79 MG/DL
HDL: 79 MG/DL
LDL Calculated: 115.2 MG/DL — ABNORMAL HIGH (ref 0–100)
LDL, calculated: 115.2 MG/DL — ABNORMAL HIGH (ref 0–100)
Triglyceride: 59 MG/DL (ref <150)
Triglycerides: 59 MG/DL (ref <150)
VLDL Cholesterol Calculated: 11.8 MG/DL
VLDL, calculated: 11.8 MG/DL

## 2021-01-23 LAB — COMPREHENSIVE METABOLIC PANEL
ALT: 9 U/L — ABNORMAL LOW (ref 12–78)
AST: 7 U/L — ABNORMAL LOW (ref 15–37)
Albumin/Globulin Ratio: 1 — ABNORMAL LOW (ref 1.1–2.2)
Albumin: 3.8 g/dL (ref 3.5–5.0)
Alkaline Phosphatase: 84 U/L (ref 45–117)
Anion Gap: 4 mmol/L — ABNORMAL LOW (ref 5–15)
BUN: 10 MG/DL (ref 6–20)
Bun/Cre Ratio: 13 (ref 12–20)
CO2: 29 mmol/L (ref 21–32)
Calcium: 8.8 MG/DL (ref 8.5–10.1)
Chloride: 105 mmol/L (ref 97–108)
Creatinine: 0.79 MG/DL (ref 0.55–1.02)
ESTIMATED GLOMERULAR FILTRATION RATE: >60 mL/min/{1.73_m2} (ref >60)
Globulin: 3.9 g/dL (ref 2.0–4.0)
Glucose: 89 mg/dL (ref 65–100)
Potassium: 3.8 mmol/L (ref 3.5–5.1)
Sodium: 138 mmol/L (ref 136–145)
Total Bilirubin: 0.5 MG/DL (ref 0.2–1.0)
Total Protein: 7.7 g/dL (ref 6.4–8.2)

## 2021-01-23 LAB — TSH 3RD GENERATION
TSH: 1.94 u[IU]/mL (ref 0.36–3.74)
TSH: 1.94 u[IU]/mL (ref 0.36–3.74)

## 2021-01-23 LAB — RPR
RPR: NONREACTIVE
RPR: NONREACTIVE

## 2021-01-23 LAB — METABOLIC PANEL, COMPREHENSIVE
A-G Ratio: 1 — ABNORMAL LOW (ref 1.1–2.2)
ALT (SGPT): 9 U/L — ABNORMAL LOW (ref 12–78)
AST (SGOT): 7 U/L — ABNORMAL LOW (ref 15–37)
Albumin: 3.8 g/dL (ref 3.5–5.0)
Alk. phosphatase: 84 U/L (ref 45–117)
Anion gap: 4 mmol/L — ABNORMAL LOW (ref 5–15)
BUN/Creatinine ratio: 13 (ref 12–20)
BUN: 10 MG/DL (ref 6–20)
Bilirubin, total: 0.5 MG/DL (ref 0.2–1.0)
CO2: 29 mmol/L (ref 21–32)
Calcium: 8.8 MG/DL (ref 8.5–10.1)
Chloride: 105 mmol/L (ref 97–108)
Creatinine: 0.79 MG/DL (ref 0.55–1.02)
Globulin: 3.9 g/dL (ref 2.0–4.0)
Glucose: 89 mg/dL (ref 65–100)
Potassium: 3.8 mmol/L (ref 3.5–5.1)
Protein, total: 7.7 g/dL (ref 6.4–8.2)
Sodium: 138 mmol/L (ref 136–145)
eGFR: >60 mL/min/{1.73_m2} (ref >60)

## 2021-03-22 ENCOUNTER — Encounter: Attending: Family Medicine | Primary: Family Medicine

## 2021-04-26 ENCOUNTER — Emergency Department: Admit: 2021-04-26 | Payer: MEDICAID | Primary: Family Medicine

## 2021-04-26 ENCOUNTER — Inpatient Hospital Stay
Admit: 2021-04-26 | Discharge: 2021-04-26 | Disposition: A | Discharge status: Home / Self Care | Payer: MEDICAID | Attending: Emergency Medicine

## 2021-04-26 DIAGNOSIS — R079 Chest pain, unspecified: Secondary | ICD-10-CM

## 2021-04-26 LAB — COMPREHENSIVE METABOLIC PANEL
ALT: 12 U/L (ref 12–78)
AST: 22 U/L (ref 15–37)
Albumin/Globulin Ratio: 0.8 — ABNORMAL LOW (ref 1.1–2.2)
Albumin: 3.6 g/dL (ref 3.5–5.0)
Alkaline Phosphatase: 81 U/L (ref 45–117)
Anion Gap: 5 mmol/L (ref 5–15)
BUN: 12 MG/DL (ref 6–20)
Bun/Cre Ratio: 17 (ref 12–20)
CO2: 26 mmol/L (ref 21–32)
Calcium: 9.3 MG/DL (ref 8.5–10.1)
Chloride: 106 mmol/L (ref 97–108)
Creatinine: 0.72 MG/DL (ref 0.55–1.02)
ESTIMATED GLOMERULAR FILTRATION RATE: >60 mL/min/{1.73_m2} (ref >60)
Globulin: 4.6 g/dL — ABNORMAL HIGH (ref 2.0–4.0)
Glucose: 94 mg/dL (ref 65–100)
Potassium: 4.1 mmol/L (ref 3.5–5.1)
Sodium: 137 mmol/L (ref 136–145)
Total Bilirubin: 0.5 MG/DL (ref 0.2–1.0)
Total Protein: 8.2 g/dL (ref 6.4–8.2)

## 2021-04-26 LAB — CBC WITH AUTO DIFFERENTIAL
Basophils %: 0 % (ref 0–1)
Basophils Absolute: 0 10*3/uL (ref 0.0–0.1)
Eosinophils %: 1 % (ref 0–7)
Eosinophils Absolute: 0.1 10*3/uL (ref 0.0–0.4)
Granulocyte Absolute Count: 0 10*3/uL (ref 0.00–0.04)
Hematocrit: 34.6 % — ABNORMAL LOW (ref 35.0–47.0)
Hemoglobin: 11.4 g/dL — ABNORMAL LOW (ref 11.5–16.0)
Immature Granulocytes: 0 % (ref 0.0–0.5)
Lymphocytes %: 23 % (ref 12–49)
Lymphocytes Absolute: 1.8 10*3/uL (ref 0.8–3.5)
MCH: 31.1 PG (ref 26.0–34.0)
MCHC: 32.9 g/dL (ref 30.0–36.5)
MCV: 94.3 FL (ref 80.0–99.0)
MPV: 11.7 FL (ref 8.9–12.9)
Monocytes %: 5 % (ref 5–13)
Monocytes Absolute: 0.4 10*3/uL (ref 0.0–1.0)
NRBC Absolute: 0 10*3/uL (ref 0.00–0.01)
Neutrophils %: 71 % (ref 32–75)
Neutrophils Absolute: 5.7 10*3/uL (ref 1.8–8.0)
Nucleated RBCs: 0 PER 100 WBC
Platelets: 244 10*3/uL (ref 150–400)
RBC: 3.67 M/uL — ABNORMAL LOW (ref 3.80–5.20)
RDW: 13.2 % (ref 11.5–14.5)
WBC: 8 10*3/uL (ref 3.6–11.0)

## 2021-04-26 LAB — URINALYSIS WITH MICROSCOPIC
Bilirubin, Urine: NEGATIVE
Blood, Urine: NEGATIVE
Glucose, Ur: NEGATIVE mg/dL
Leukocyte Esterase, Urine: NEGATIVE
Nitrite, Urine: NEGATIVE
Protein, UA: NEGATIVE mg/dL
Specific Gravity, UA: 1.025 (ref 1.003–1.030)
Urobilinogen, UA, POCT: 1 EU/dL (ref 0.2–1.0)
pH, UA: 7 (ref 5.0–8.0)

## 2021-04-26 LAB — TSH 3RD GENERATION
TSH: 1.17 u[IU]/mL (ref 0.36–3.74)
TSH: 1.17 u[IU]/mL (ref 0.36–3.74)

## 2021-04-26 LAB — HCG URINE, QL. - POC
HCG, Pregnancy, Urine, POC: NEGATIVE
Pregnancy test,urine (POC): NEGATIVE

## 2021-04-26 LAB — TROPONIN, HIGH SENSITIVITY: Troponin, High Sensitivity: <3 ng/L (ref 0–37)

## 2021-04-26 LAB — T4, FREE
T4 Free: 0.9 NG/DL (ref 0.8–1.5)
T4, Free: 0.9 NG/DL (ref 0.8–1.5)

## 2021-04-26 LAB — D-DIMER, QUANTITATIVE: D-Dimer, Quant: 0.34 mg/L FEU (ref 0.00–0.65)

## 2021-04-26 LAB — LIPASE
Lipase: 70 U/L — ABNORMAL LOW (ref 73–393)
Lipase: 70 U/L — ABNORMAL LOW (ref 73–393)

## 2021-04-26 LAB — URINALYSIS W/MICROSCOPIC
Bilirubin: NEGATIVE
Blood: NEGATIVE
Glucose: NEGATIVE mg/dL
Leukocyte Esterase: NEGATIVE
Nitrites: NEGATIVE
Protein: NEGATIVE mg/dL
Specific gravity: 1.025 (ref 1.003–1.030)
Urobilinogen: 1 EU/dL (ref 0.2–1.0)
pH (UA): 7 (ref 5.0–8.0)

## 2021-04-26 LAB — CBC WITH AUTOMATED DIFF
ABS. BASOPHILS: 0 10*3/uL (ref 0.0–0.1)
ABS. EOSINOPHILS: 0.1 10*3/uL (ref 0.0–0.4)
ABS. IMM. GRANS.: 0 10*3/uL (ref 0.00–0.04)
ABS. LYMPHOCYTES: 1.8 10*3/uL (ref 0.8–3.5)
ABS. MONOCYTES: 0.4 10*3/uL (ref 0.0–1.0)
ABS. NEUTROPHILS: 5.7 10*3/uL (ref 1.8–8.0)
ABSOLUTE NRBC: 0 10*3/uL (ref 0.00–0.01)
BASOPHILS: 0 % (ref 0–1)
EOSINOPHILS: 1 % (ref 0–7)
HCT: 34.6 % — ABNORMAL LOW (ref 35.0–47.0)
HGB: 11.4 g/dL — ABNORMAL LOW (ref 11.5–16.0)
IMMATURE GRANULOCYTES: 0 % (ref 0.0–0.5)
LYMPHOCYTES: 23 % (ref 12–49)
MCH: 31.1 PG (ref 26.0–34.0)
MCHC: 32.9 g/dL (ref 30.0–36.5)
MCV: 94.3 FL (ref 80.0–99.0)
MONOCYTES: 5 % (ref 5–13)
MPV: 11.7 FL (ref 8.9–12.9)
NEUTROPHILS: 71 % (ref 32–75)
NRBC: 0 PER 100 WBC
PLATELET: 244 10*3/uL (ref 150–400)
RBC: 3.67 M/uL — ABNORMAL LOW (ref 3.80–5.20)
RDW: 13.2 % (ref 11.5–14.5)
WBC: 8 10*3/uL (ref 3.6–11.0)

## 2021-04-26 LAB — METABOLIC PANEL, COMPREHENSIVE
A-G Ratio: 0.8 — ABNORMAL LOW (ref 1.1–2.2)
ALT (SGPT): 12 U/L (ref 12–78)
AST (SGOT): 22 U/L (ref 15–37)
Albumin: 3.6 g/dL (ref 3.5–5.0)
Alk. phosphatase: 81 U/L (ref 45–117)
Anion gap: 5 mmol/L (ref 5–15)
BUN/Creatinine ratio: 17 (ref 12–20)
BUN: 12 MG/DL (ref 6–20)
Bilirubin, total: 0.5 MG/DL (ref 0.2–1.0)
CO2: 26 mmol/L (ref 21–32)
Calcium: 9.3 MG/DL (ref 8.5–10.1)
Chloride: 106 mmol/L (ref 97–108)
Creatinine: 0.72 MG/DL (ref 0.55–1.02)
Globulin: 4.6 g/dL — ABNORMAL HIGH (ref 2.0–4.0)
Glucose: 94 mg/dL (ref 65–100)
Potassium: 4.1 mmol/L (ref 3.5–5.1)
Protein, total: 8.2 g/dL (ref 6.4–8.2)
Sodium: 137 mmol/L (ref 136–145)
eGFR: >60 mL/min/{1.73_m2} (ref >60)

## 2021-04-26 LAB — SAMPLES BEING HELD

## 2021-04-26 LAB — TROPONIN-HIGH SENSITIVITY: Troponin-High Sensitivity: <3 ng/L (ref 0–37)

## 2021-04-26 LAB — URINE CULTURE HOLD SAMPLE

## 2021-04-26 LAB — D DIMER: D-dimer: 0.34 mg/L FEU (ref 0.00–0.65)

## 2021-04-26 NOTE — ED Notes (Signed)
 Pt arrives from work for chest pain the radiated down left arm. Pt reports no cardiac history. Did not take any meds for pain.

## 2021-04-26 NOTE — ED Notes (Signed)
Discharge instructions done by provider.
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
## 2021-04-27 LAB — EKG 12-LEAD
Atrial Rate: 62 {beats}/min
Diagnosis: NORMAL
P Axis: 40 degrees
P-R Interval: 158 ms
Q-T Interval: 404 ms
QRS Duration: 90 ms
QTc Calculation (Bazett): 410 ms
R Axis: 88 degrees
T Axis: 27 degrees
Ventricular Rate: 62 {beats}/min

## 2021-04-27 LAB — EKG, 12 LEAD, INITIAL
Atrial Rate: 62 {beats}/min
Calculated P Axis: 40 degrees
Calculated R Axis: 88 degrees
Calculated T Axis: 27 degrees
Diagnosis: NORMAL
P-R Interval: 158 ms
Q-T Interval: 404 ms
QRS Duration: 90 ms
QTC Calculation (Bezet): 410 ms
Ventricular Rate: 62 {beats}/min

## 2022-08-27 ENCOUNTER — Ambulatory Visit: Attending: Family Medicine | Primary: Family Medicine

## 2022-11-28 ENCOUNTER — Ambulatory Visit: Attending: Family Medicine | Primary: Family Medicine
